# Patient Record
Sex: Male | Born: 1980 | Race: Black or African American | Hispanic: No | Marital: Single | Smoking: Former smoker
Health system: Southern US, Community
[De-identification: ages and names within clinical notes are randomized; demographics above are authoritative.]

## PROBLEM LIST (undated history)

## (undated) DIAGNOSIS — E0500 Thyrotoxicosis with diffuse goiter without thyrotoxic crisis or storm: Secondary | ICD-10-CM

## (undated) DIAGNOSIS — F1490 Cocaine use, unspecified, uncomplicated: Secondary | ICD-10-CM

## (undated) DIAGNOSIS — Z7289 Other problems related to lifestyle: Secondary | ICD-10-CM

## (undated) DIAGNOSIS — E0590 Thyrotoxicosis, unspecified without thyrotoxic crisis or storm: Secondary | ICD-10-CM

## (undated) DIAGNOSIS — R7989 Other specified abnormal findings of blood chemistry: Secondary | ICD-10-CM

## (undated) DIAGNOSIS — F1290 Cannabis use, unspecified, uncomplicated: Secondary | ICD-10-CM

## (undated) DIAGNOSIS — F1090 Alcohol use, unspecified, uncomplicated: Secondary | ICD-10-CM

## (undated) DIAGNOSIS — I4892 Unspecified atrial flutter: Secondary | ICD-10-CM

## (undated) DIAGNOSIS — Z789 Other specified health status: Secondary | ICD-10-CM

## (undated) DIAGNOSIS — J302 Other seasonal allergic rhinitis: Secondary | ICD-10-CM

## (undated) DIAGNOSIS — I429 Cardiomyopathy, unspecified: Secondary | ICD-10-CM

## (undated) DIAGNOSIS — E079 Disorder of thyroid, unspecified: Secondary | ICD-10-CM

## (undated) HISTORY — DX: Thyrotoxicosis, unspecified without thyrotoxic crisis or storm: E05.90

## (undated) HISTORY — DX: Cardiomyopathy, unspecified: I42.9

## (undated) HISTORY — DX: Cannabis use, unspecified, uncomplicated: F12.90

## (undated) HISTORY — DX: Cocaine use, unspecified, uncomplicated: F14.90

## (undated) HISTORY — DX: Other specified health status: Z78.9

## (undated) HISTORY — DX: Alcohol use, unspecified, uncomplicated: F10.90

## (undated) HISTORY — DX: Other problems related to lifestyle: Z72.89

## (undated) HISTORY — PX: OTHER SURGICAL HISTORY: SHX169

## (undated) HISTORY — DX: Disorder of thyroid, unspecified: E07.9

## (undated) HISTORY — DX: Unspecified atrial flutter: I48.92

## (undated) HISTORY — DX: Thyrotoxicosis with diffuse goiter without thyrotoxic crisis or storm: E05.00

---

## 2001-09-01 ENCOUNTER — Emergency Department (HOSPITAL_COMMUNITY): Admission: EM | Admit: 2001-09-01 | Discharge: 2001-09-01 | Payer: Self-pay | Admitting: Emergency Medicine

## 2018-01-10 ENCOUNTER — Inpatient Hospital Stay (HOSPITAL_COMMUNITY)
Admission: EM | Admit: 2018-01-10 | Discharge: 2018-01-19 | DRG: 308 | Disposition: A | Discharge status: Home / Self Care | Payer: BLUE CROSS/BLUE SHIELD | Attending: Internal Medicine | Admitting: Internal Medicine

## 2018-01-10 ENCOUNTER — Encounter (HOSPITAL_COMMUNITY): Payer: Self-pay | Admitting: *Deleted

## 2018-01-10 ENCOUNTER — Other Ambulatory Visit: Payer: Self-pay

## 2018-01-10 ENCOUNTER — Emergency Department (HOSPITAL_COMMUNITY): Payer: BLUE CROSS/BLUE SHIELD

## 2018-01-10 DIAGNOSIS — Z7151 Drug abuse counseling and surveillance of drug abuser: Secondary | ICD-10-CM

## 2018-01-10 DIAGNOSIS — I4891 Unspecified atrial fibrillation: Secondary | ICD-10-CM | POA: Diagnosis present

## 2018-01-10 DIAGNOSIS — R0602 Shortness of breath: Secondary | ICD-10-CM

## 2018-01-10 DIAGNOSIS — I313 Pericardial effusion (noninflammatory): Secondary | ICD-10-CM | POA: Diagnosis present

## 2018-01-10 DIAGNOSIS — I4892 Unspecified atrial flutter: Secondary | ICD-10-CM

## 2018-01-10 DIAGNOSIS — F1410 Cocaine abuse, uncomplicated: Secondary | ICD-10-CM | POA: Diagnosis present

## 2018-01-10 DIAGNOSIS — E663 Overweight: Secondary | ICD-10-CM | POA: Diagnosis present

## 2018-01-10 DIAGNOSIS — R7989 Other specified abnormal findings of blood chemistry: Secondary | ICD-10-CM | POA: Diagnosis not present

## 2018-01-10 DIAGNOSIS — I484 Atypical atrial flutter: Secondary | ICD-10-CM | POA: Diagnosis not present

## 2018-01-10 DIAGNOSIS — I5021 Acute systolic (congestive) heart failure: Secondary | ICD-10-CM | POA: Diagnosis present

## 2018-01-10 DIAGNOSIS — Z79899 Other long term (current) drug therapy: Secondary | ICD-10-CM

## 2018-01-10 DIAGNOSIS — J302 Other seasonal allergic rhinitis: Secondary | ICD-10-CM | POA: Diagnosis present

## 2018-01-10 DIAGNOSIS — Z6829 Body mass index (BMI) 29.0-29.9, adult: Secondary | ICD-10-CM

## 2018-01-10 DIAGNOSIS — Z833 Family history of diabetes mellitus: Secondary | ICD-10-CM

## 2018-01-10 DIAGNOSIS — I429 Cardiomyopathy, unspecified: Secondary | ICD-10-CM

## 2018-01-10 DIAGNOSIS — F17210 Nicotine dependence, cigarettes, uncomplicated: Secondary | ICD-10-CM | POA: Diagnosis present

## 2018-01-10 DIAGNOSIS — F1210 Cannabis abuse, uncomplicated: Secondary | ICD-10-CM | POA: Diagnosis present

## 2018-01-10 DIAGNOSIS — Z87898 Personal history of other specified conditions: Secondary | ICD-10-CM | POA: Diagnosis not present

## 2018-01-10 DIAGNOSIS — I959 Hypotension, unspecified: Secondary | ICD-10-CM | POA: Diagnosis not present

## 2018-01-10 DIAGNOSIS — F1411 Cocaine abuse, in remission: Secondary | ICD-10-CM

## 2018-01-10 DIAGNOSIS — E0500 Thyrotoxicosis with diffuse goiter without thyrotoxic crisis or storm: Secondary | ICD-10-CM | POA: Diagnosis present

## 2018-01-10 DIAGNOSIS — E0590 Thyrotoxicosis, unspecified without thyrotoxic crisis or storm: Secondary | ICD-10-CM | POA: Diagnosis present

## 2018-01-10 DIAGNOSIS — R002 Palpitations: Secondary | ICD-10-CM | POA: Diagnosis not present

## 2018-01-10 DIAGNOSIS — Z8371 Family history of colonic polyps: Secondary | ICD-10-CM

## 2018-01-10 DIAGNOSIS — I428 Other cardiomyopathies: Secondary | ICD-10-CM | POA: Diagnosis present

## 2018-01-10 HISTORY — DX: Other seasonal allergic rhinitis: J30.2

## 2018-01-10 HISTORY — DX: Unspecified atrial flutter: I48.92

## 2018-01-10 HISTORY — DX: Other specified abnormal findings of blood chemistry: R79.89

## 2018-01-10 LAB — CBC WITH DIFFERENTIAL/PLATELET
Basophils Absolute: 0 10*3/uL (ref 0.0–0.1)
Basophils Relative: 0 %
Eosinophils Absolute: 0.1 10*3/uL (ref 0.0–0.7)
Eosinophils Relative: 1 %
HEMATOCRIT: 43.6 % (ref 39.0–52.0)
HEMOGLOBIN: 14.2 g/dL (ref 13.0–17.0)
LYMPHS ABS: 2.7 10*3/uL (ref 0.7–4.0)
Lymphocytes Relative: 35 %
MCH: 27.5 pg (ref 26.0–34.0)
MCHC: 32.6 g/dL (ref 30.0–36.0)
MCV: 84.3 fL (ref 78.0–100.0)
MONOS PCT: 6 %
Monocytes Absolute: 0.5 10*3/uL (ref 0.1–1.0)
NEUTROS ABS: 4.5 10*3/uL (ref 1.7–7.7)
NEUTROS PCT: 58 %
Platelets: 228 10*3/uL (ref 150–400)
RBC: 5.17 MIL/uL (ref 4.22–5.81)
RDW: 14.5 % (ref 11.5–15.5)
WBC: 7.8 10*3/uL (ref 4.0–10.5)

## 2018-01-10 LAB — COMPREHENSIVE METABOLIC PANEL
ALBUMIN: 3.6 g/dL (ref 3.5–5.0)
ALK PHOS: 93 U/L (ref 38–126)
ALT: 28 U/L (ref 17–63)
AST: 24 U/L (ref 15–41)
Anion gap: 11 (ref 5–15)
BUN: 16 mg/dL (ref 6–20)
CO2: 19 mmol/L — AB (ref 22–32)
Calcium: 9 mg/dL (ref 8.9–10.3)
Chloride: 113 mmol/L — ABNORMAL HIGH (ref 101–111)
Creatinine, Ser: 0.89 mg/dL (ref 0.61–1.24)
GFR calc Af Amer: >60 mL/min (ref >60)
GFR calc non Af Amer: >60 mL/min (ref >60)
GLUCOSE: 92 mg/dL (ref 65–99)
POTASSIUM: 3.8 mmol/L (ref 3.5–5.1)
SODIUM: 143 mmol/L (ref 135–145)
Total Bilirubin: 1.8 mg/dL — ABNORMAL HIGH (ref 0.3–1.2)
Total Protein: 6.5 g/dL (ref 6.5–8.1)

## 2018-01-10 LAB — I-STAT TROPONIN, ED: Troponin i, poc: 0.02 ng/mL (ref 0.00–0.08)

## 2018-01-10 LAB — ETHANOL: Alcohol, Ethyl (B): <10 mg/dL (ref <10)

## 2018-01-10 LAB — URINALYSIS, ROUTINE W REFLEX MICROSCOPIC
Bilirubin Urine: NEGATIVE
GLUCOSE, UA: NEGATIVE mg/dL
Hgb urine dipstick: NEGATIVE
Ketones, ur: 5 mg/dL — AB
LEUKOCYTES UA: NEGATIVE
Nitrite: NEGATIVE
PROTEIN: NEGATIVE mg/dL
Specific Gravity, Urine: 1.025 (ref 1.005–1.030)
pH: 6 (ref 5.0–8.0)

## 2018-01-10 LAB — RAPID URINE DRUG SCREEN, HOSP PERFORMED
AMPHETAMINES: NOT DETECTED
BENZODIAZEPINES: NOT DETECTED
Barbiturates: NOT DETECTED
Cocaine: NOT DETECTED
OPIATES: NOT DETECTED
TETRAHYDROCANNABINOL: POSITIVE — AB

## 2018-01-10 LAB — MRSA PCR SCREENING: MRSA BY PCR: NEGATIVE

## 2018-01-10 LAB — PROTIME-INR
INR: 1.29
Prothrombin Time: 16 seconds — ABNORMAL HIGH (ref 11.4–15.2)

## 2018-01-10 LAB — LACTIC ACID, PLASMA: Lactic Acid, Venous: 1.2 mmol/L (ref 0.5–1.9)

## 2018-01-10 LAB — TROPONIN I: TROPONIN I: 0.04 ng/mL — AB (ref <0.03)

## 2018-01-10 LAB — TSH: TSH: <0.01 u[IU]/mL — ABNORMAL LOW (ref 0.350–4.500)

## 2018-01-10 LAB — T4, FREE: FREE T4: 4.13 ng/dL — AB (ref 0.82–1.77)

## 2018-01-10 LAB — HEPARIN LEVEL (UNFRACTIONATED): Heparin Unfractionated: 0.39 IU/mL (ref 0.30–0.70)

## 2018-01-10 LAB — PHOSPHORUS: Phosphorus: 3.5 mg/dL (ref 2.5–4.6)

## 2018-01-10 LAB — MAGNESIUM: Magnesium: 1.8 mg/dL (ref 1.7–2.4)

## 2018-01-10 MED ORDER — SODIUM CHLORIDE 0.9 % IV SOLN
INTRAVENOUS | Status: AC
  Administered 2018-01-10 – 2018-01-11 (×2): via INTRAVENOUS

## 2018-01-10 MED ORDER — ALPRAZOLAM 0.25 MG PO TABS
0.2500 mg | ORAL_TABLET | Freq: Once | ORAL | Status: AC
  Administered 2018-01-11: 0.25 mg via ORAL
  Filled 2018-01-10: qty 1

## 2018-01-10 MED ORDER — ACETAMINOPHEN 650 MG RE SUPP: 650.0000 mg | Freq: Four times a day (QID) | RECTAL | Status: DC | PRN

## 2018-01-10 MED ORDER — LORAZEPAM 1 MG PO TABS: 1.0000 mg | ORAL_TABLET | Freq: Four times a day (QID) | ORAL | Status: AC | PRN

## 2018-01-10 MED ORDER — THIAMINE HCL 100 MG/ML IJ SOLN: 100.0000 mg | Freq: Every day | INTRAMUSCULAR | Status: DC

## 2018-01-10 MED ORDER — FOLIC ACID 1 MG PO TABS
1.0000 mg | ORAL_TABLET | Freq: Every day | ORAL | Status: DC
  Administered 2018-01-11 – 2018-01-19 (×9): 1 mg via ORAL
  Filled 2018-01-10 (×9): qty 1

## 2018-01-10 MED ORDER — ONDANSETRON HCL 4 MG PO TABS: 4.0000 mg | ORAL_TABLET | Freq: Four times a day (QID) | ORAL | Status: DC | PRN

## 2018-01-10 MED ORDER — VITAMIN B-1 100 MG PO TABS
100.0000 mg | ORAL_TABLET | Freq: Every day | ORAL | Status: DC
  Administered 2018-01-11 – 2018-01-19 (×9): 100 mg via ORAL
  Filled 2018-01-10 (×9): qty 1

## 2018-01-10 MED ORDER — DILTIAZEM HCL-DEXTROSE 100-5 MG/100ML-% IV SOLN (PREMIX)
5.0000 mg/h | INTRAVENOUS | Status: DC
  Administered 2018-01-10 (×3): 15 mg/h via INTRAVENOUS
  Administered 2018-01-10: 5 mg/h via INTRAVENOUS
  Administered 2018-01-11 (×3): 15 mg/h via INTRAVENOUS
  Administered 2018-01-12: 5 mg/h via INTRAVENOUS
  Administered 2018-01-12: 15 mg/h via INTRAVENOUS
  Administered 2018-01-12: 10 mg/h via INTRAVENOUS
  Filled 2018-01-10 (×11): qty 100

## 2018-01-10 MED ORDER — HEPARIN BOLUS VIA INFUSION
4000.0000 [IU] | Freq: Once | INTRAVENOUS | Status: AC
  Administered 2018-01-10: 4000 [IU] via INTRAVENOUS
  Filled 2018-01-10: qty 4000

## 2018-01-10 MED ORDER — DILTIAZEM LOAD VIA INFUSION
20.0000 mg | Freq: Once | INTRAVENOUS | Status: AC
  Administered 2018-01-10: 20 mg via INTRAVENOUS
  Filled 2018-01-10: qty 20

## 2018-01-10 MED ORDER — ONDANSETRON HCL 4 MG/2ML IJ SOLN: 4.0000 mg | Freq: Four times a day (QID) | INTRAMUSCULAR | Status: DC | PRN

## 2018-01-10 MED ORDER — DILTIAZEM HCL 25 MG/5ML IV SOLN: 15.0000 mg | Freq: Once | INTRAVENOUS | Status: DC

## 2018-01-10 MED ORDER — ADULT MULTIVITAMIN W/MINERALS CH
1.0000 | ORAL_TABLET | Freq: Every day | ORAL | Status: DC
Start: 2018-01-11 — End: 2018-01-19
  Administered 2018-01-11 – 2018-01-19 (×9): 1 via ORAL
  Filled 2018-01-10 (×9): qty 1

## 2018-01-10 MED ORDER — DILTIAZEM LOAD VIA INFUSION
15.0000 mg | Freq: Once | INTRAVENOUS | Status: AC
  Administered 2018-01-10: 15 mg via INTRAVENOUS
  Filled 2018-01-10: qty 15

## 2018-01-10 MED ORDER — HEPARIN (PORCINE) IN NACL 100-0.45 UNIT/ML-% IJ SOLN
1500.0000 [IU]/h | INTRAMUSCULAR | Status: DC
  Administered 2018-01-10 – 2018-01-11 (×2): 1500 [IU]/h via INTRAVENOUS
  Filled 2018-01-10 (×2): qty 250

## 2018-01-10 MED ORDER — ACETAMINOPHEN 325 MG PO TABS
650.0000 mg | ORAL_TABLET | Freq: Four times a day (QID) | ORAL | Status: DC | PRN
  Administered 2018-01-17 – 2018-01-18 (×3): 650 mg via ORAL
  Filled 2018-01-10 (×3): qty 2

## 2018-01-10 MED ORDER — LORAZEPAM 2 MG/ML IJ SOLN: 1.0000 mg | Freq: Four times a day (QID) | INTRAMUSCULAR | Status: AC | PRN

## 2018-01-10 NOTE — H&P (Signed)
Logan Meadows:[id number] DOB: 1981-07-28 DOA: 01/10/2018     PCP: Patient, No Pcp Per   Outpatient Specialists:  CARDS:  Dr. Eden Emms  Patient arrived to ER on 01/10/18 at 1236  Patient coming from:   home Lives  With family    Chief Complaint:  Chief Complaint  Patient presents with  . Palpitations    HPI: Logan Meadows is a 37 y.o. male with medical history significant of allergies hx of Cocaine and Marijuana    Presented with 1 week ago he developed  Palpitations while mowing his lawn associated with Shortness of breath and fatigue.   Patient attempted to rest and drink some water palpitation symptom improved.  The palpitations have resumed every time he exerts himself he noticed also worsening shortness of breath with exertion has been lightheaded when he tries to stand up but did not pass out no leg swelling but has been short of breath at night because of his difficulties he presented to urgent care today at first he was treated with albuterol for shortness of breath but then noted to be extremely tachycardic he was found to be in atrial fibrillation sent to emergency department on arrival heart rate greater than 170 Reports cocaine was 1 weeks ago no prior history of diabetes hypertension coronary artery disease but does not follow his primary care provider.  Of note reportedly had an episode of syncope 1 month ago after standing up. He reports increased sweating feeling hot, diarrhea, losing weight for  3 weeks. He does not have regular doctor, no hx of thyroid problems in the past.    He reports drinking plenty of water but he has been  Sweating mpre than usual.   Reports drinks 12 pack only on weekends have not had any alcohol for >6 days.    While in ER: Cardiology consulted patient started on Cardizem drip for rate come down to 130s sttarted on heparin He was noted to have TSH less than 0.01  Following Medications were ordered in ER: Medications    diltiazem (CARDIZEM) 100 mg in dextrose 5% (1 mg/mL) infusion (15 mg/hr Intravenous New Bag/Given 01/10/18 1740)  heparin ADULT infusion 100 units/mL (25000 units/248mL sodium chloride 0.45%) (1,500 Units/hr Intravenous New Bag/Given 01/10/18 1343)  diltiazem (CARDIZEM) 1 mg/mL load via infusion 20 mg (20 mg Intravenous Bolus from Bag 01/10/18 1318)  heparin bolus via infusion 4,000 Units (4,000 Units Intravenous Bolus from Bag 01/10/18 1346)  diltiazem (CARDIZEM) 1 mg/mL load via infusion 15 mg (15 mg Intravenous Bolus from Bag 01/10/18 1440)    Significant initial  Findings: Abnormal Labs Reviewed  COMPREHENSIVE METABOLIC PANEL - Abnormal; Notable for the following components:      Result Value   Chloride 113 (*)    CO2 19 (*)    Total Bilirubin 1.8 (*)    All other components within normal limits  PROTIME-INR - Abnormal; Notable for the following components:   Prothrombin Time 16.0 (*)    All other components within normal limits  URINALYSIS, ROUTINE W REFLEX MICROSCOPIC - Abnormal; Notable for the following components:   Ketones, ur 5 (*)    All other components within normal limits  RAPID URINE DRUG SCREEN, HOSP PERFORMED - Abnormal; Notable for the following components:   Tetrahydrocannabinol POSITIVE (*)    All other components within normal limits  TSH - Abnormal; Notable for the following components:   TSH <0.010 (*)    All other components within normal limits  Na 143 K 3.8  Cr    stable,   Lab Results  Component Value Date   CREATININE 0.89 01/10/2018      WBC  7.8  HG/HCT     Component Value Date/Time   HGB 14.2 01/10/2018 1308   HCT 43.6 01/10/2018 1308    Troponin (Point of Care Test) Recent Labs    01/10/18 1312  TROPIPOC 0.02   INR 1.29 Mg 1.8 phosph 3.5    UA   no evidence of UTI       CXR -  NON acute   ECG:  Personally reviewed by me showing: HR : 185 Rhythm:  A.fib.RVR Ischemic changes nonspecific changes due to LVH QTC  611     ED Triage Vitals  Enc Vitals Group     BP 01/10/18 1249 (!) 124/94     Pulse Rate 01/10/18 1249 (!) 183     Resp 01/10/18 1249 (!) 24     Temp 01/10/18 1322 98.9 F (37.2 C)     Temp Source 01/10/18 1322 Oral     SpO2 01/10/18 1249 99 %     Weight 01/10/18 1307 250 lb (113.4 kg)     Height 01/10/18 1307 6\' 2"  (1.88 m)     Head Circumference --      Peak Flow --      Pain Score 01/10/18 1323 0     Pain Loc --      Pain Edu? --      Excl. in GC? --   TMAX(24)@       Latest  Blood pressure 117/75, pulse (!) 137, temperature 98.9 F (37.2 C), temperature source Oral, resp. rate (!) 23, height 6\' 2"  (1.88 m), weight 113.4 kg (250 lb), SpO2 96 %.   ER Provider Called: Cardiology    Dr. Eden Emms They Recommend admit to medicine Will see in  in ER  Hospitalist was called for admission for A. fib with RVR in the setting of hyper thyroidism   Review of Systems:    Pertinent positives include:  fatigue, syncope, Palpitations,  shortness of breath at rest, dyspnea on exertion, Constitutional:  No weight loss, night sweats, Fevers, chills, weight loss  HEENT:  No headaches, Difficulty swallowing,Tooth/dental problems,Sore throat,  No sneezing, itching, ear ache, nasal congestion, post nasal drip,  Cardio-vascular:  No chest pain, Orthopnea, PND, anasarca, dizziness, .no Bilateral lower extremity swelling  GI:  No heartburn, indigestion, abdominal pain, nausea, vomiting, diarrhea, change in bowel habits, loss of appetite, melena, blood in stool, hematemesis Resp:   No excess mucus, no productive cough, No non-productive cough, No coughing up of blood.No change in color of mucus.No wheezing. Skin:  no rash or lesions. No jaundice GU:  no dysuria, change in color of urine, no urgency or frequency. No straining to urinate.  No flank pain.  Musculoskeletal:  No joint pain or no joint swelling. No decreased range of motion. No back pain.  Psych:  No change in mood or  affect. No depression or anxiety. No memory loss.  Neuro: no localizing neurological complaints, no tingling, no weakness, no double vision, no gait abnormality, no slurred speech, no confusion  As per HPI otherwise 10 point review of systems negative.   Past Medical History:   Past Medical History:  Diagnosis Date  . Abnormal TSH 01/10/2018  . Atrial fibrillation with rapid ventricular response (HCC) 01/10/2018  . Seasonal allergies       Past Surgical History:  Procedure Laterality  Date  . None      Social History:  Ambulatory  independently      reports that he has been smoking.  He has been smoking about 0.25 packs per day. He has never used smokeless tobacco. He reports that he drinks about 7.2 oz of alcohol per week. He reports that he has current or past drug history. Drugs: Marijuana and Cocaine. Frequency: 7.00 times per week.     Family History:   Family History  Problem Relation Age of Onset  . Diabetes Mother   . Diabetes Father   . Colon polyps Father     Allergies: No Known Allergies   Prior to Admission medications   Medication Sig Start Date End Date Taking? Authorizing Provider  acetaminophen (TYLENOL) 500 MG tablet Take 500 mg by mouth every 6 (six) hours as needed.   Yes [provider]  cetirizine (ZYRTEC) 10 MG tablet Take 10 mg by mouth daily.   Yes [provider]  fluticasone (FLONASE) 50 MCG/ACT nasal spray Place 2 sprays into both nostrils daily as needed for allergies or rhinitis.   Yes [provider]   Physical Exam: Blood pressure 117/75, pulse (!) 137, temperature 98.9 F (37.2 C), temperature source Oral, resp. rate (!) 23, height 6\' 2"  (1.88 m), weight 113.4 kg (250 lb), SpO2 96 %. 1. General:  in No Acute distress  well   -appearing 2. Psychological: Alert and  Oriented 3. Head/ENT:    Dry Mucous Membranes                          Head Non traumatic, neck supple                          Normal   Dentition Thyroid no evidence of goiter noted appears slightly rubbery more on the right no palpable nodules nontender 4. SKIN: decreased Skin turgor,  Skin clean Dry and intact no rash 5. Heart: rapid, irRegular rate and rhythm no  Murmur, no Rub or gallop 6. Lungs:  Clear to auscultation bilaterally, no wheezes or crackles   7. Abdomen: Soft,  non-tender, Non distended    bowel sounds present 8. Lower extremities: no clubbing, cyanosis, or edema 9. Neurologically Grossly intact, moving all 4 extremities equally  10. MSK: Normal range of motion   LABS:     Recent Labs  Lab 01/10/18 1308  WBC 7.8  NEUTROABS 4.5  HGB 14.2  HCT 43.6  MCV 84.3  PLT 228   Basic Metabolic Panel: Recent Labs  Lab 01/10/18 1308  NA 143  K 3.8  CL 113*  CO2 19*  GLUCOSE 92  BUN 16  CREATININE 0.89  CALCIUM 9.0  MG 1.8  PHOS 3.5      Recent Labs  Lab 01/10/18 1308  AST 24  ALT 28  ALKPHOS 93  BILITOT 1.8*  PROT 6.5  ALBUMIN 3.6   No results for input(s): LIPASE, AMYLASE in the last 168 hours. No results for input(s): AMMONIA in the last 168 hours.    HbA1C: No results for input(s): HGBA1C in the last 72 hours. CBG: No results for input(s): GLUCAP in the last 168 hours.    Urine analysis:    Component Value Date/Time   COLORURINE YELLOW 01/10/2018 1623   APPEARANCEUR CLEAR 01/10/2018 1623   LABSPEC 1.025 01/10/2018 1623   PHURINE 6.0 01/10/2018 1623   GLUCOSEU NEGATIVE 01/10/2018 1623  HGBUR NEGATIVE 01/10/2018 1623   BILIRUBINUR NEGATIVE 01/10/2018 1623   KETONESUR 5 (A) 01/10/2018 1623   PROTEINUR NEGATIVE 01/10/2018 1623   NITRITE NEGATIVE 01/10/2018 1623   LEUKOCYTESUR NEGATIVE 01/10/2018 1623      Cultures: No results found for: SDES, SPECREQUEST, CULT, REPTSTATUS   Radiological Exams on Admission: Dg Chest Port 1 View  Result Date: 01/10/2018 CLINICAL DATA:  Fluttering sensation in the chest for the past week. EXAM: PORTABLE CHEST 1 VIEW COMPARISON:   None. FINDINGS: Mild basilar atelectasis is seen. Heart size is upper normal. No pneumothorax or pleural effusion. No acute bony abnormality. IMPRESSION: No acute disease. Electronically Signed   By: Drusilla Kanner M.D.   On: 01/10/2018 14:24    Chart has been reviewed    Assessment/Plan  37 y.o. male with medical history significant of allergies hx of Cocaine and [place] Admitted for onset of A. fib with RVR in the setting of hyperthyroidism  Present on Admission: . Atrial fibrillation with rapid ventricular response (HCC) -in the setting of possible thyroidism history of cocaine abuse. Appreciate cardiology consult will transition to DOCA when stable, continue with Cardizem for now given history of cocaine order echogram.  . Abnormal TSH -low TSH, suspect hyperthyroidism  check T4 T3 Check Thyrotropin receptor Ab On Physical exam no evidence of tenderness but noted slight asymetry discernible nodules palpated  Discussed with radiology will order thyroid reuptake scan If evidence of Graves' disease would need to have discussion regarding long-term treatment methimazole versus radioiodine ablation  hx of cocaine abuse -cardiology felt not a candidate for beta-blocker but patient states he is agreeable to abstain from cocaine last cocaine was 1 week ago.  Defer to cardiology to see if it was safe to start if patient is reliable.  social work consult  Other plan as per orders.  DVT prophylaxis: Heparin  Code Status:  FULL CODE as per patient   I had personally discussed CODE STATUS with patient   Family Communication:   Family not  at  Bedside    Disposition Plan:     To home once workup is complete and patient is stable                      Social Work   consulted                          Consults called: Cardiology aware  Admission status:   obs   Level of care        SDU        Kaedynce Tapp 01/10/2018, 7:57 PM    [facility]  Pager [phone]    after 2 AM please page floor coverage PA If 7AM-7PM, please contact the day team taking care of the patient  [email or web address]  Password [email or web address]

## 2018-01-10 NOTE — ED Notes (Signed)
ED TO INPATIENT HANDOFF REPORT  Name/Age/Gender Logan Meadows 37 y.o. male  Code Status   Home/SNF/Other Home  Chief Complaint heart flutter  Level of Care/Admitting Diagnosis ED Disposition    ED Disposition Condition [facility] Area: [place] [100102]  Level of Care: Stepdown [14]  Admit to SDU based on following criteria: Hemodynamic compromise or significant risk of instability:  Patient requiring short term acute titration and management of vasoactive drips, and invasive monitoring (i.e., CVP and Arterial line).  Diagnosis: Atrial fibrillation with RVR [facility]) [[id number]]  Admitting Physician: Toy Baker [[id number]]  Attending Physician: Toy Baker [[id number]]  PT Class (Do Not Modify): Observation [104]  PT Acc Code (Do Not Modify): Observation [10022]       Medical History Past Medical History:  Diagnosis Date  . Abnormal TSH 01/10/2018  . Atrial fibrillation with rapid ventricular response ([place]) 01/10/2018  . Seasonal allergies     Allergies No Known Allergies  IV Location/Drains/Wounds Patient Lines/Drains/Airways Status   Active Line/Drains/Airways    Name:   Placement date:   Placement time:   Site:   Days:   Peripheral IV 01/10/18 Right Hand   01/10/18    1326    Hand   less than 1   Peripheral IV 01/10/18 Left Antecubital   01/10/18    1326    Antecubital   less than 1          Labs/Imaging Results for orders placed or performed during the hospital encounter of 01/10/18 (from the past 48 hour(s))  Comprehensive metabolic panel     Status: Abnormal   Collection Time: 01/10/18  1:08 PM  Result Value Ref Range   Sodium 143 135 - 145 mmol/L   Potassium 3.8 3.5 - 5.1 mmol/L   Chloride 113 (H) 101 - 111 mmol/L   CO2 19 (L) 22 - 32 mmol/L   Glucose, Bld 92 65 - 99 mg/dL   BUN 16 6 - 20 mg/dL   Creatinine, Ser 0.89 0.61 - 1.24 mg/dL   Calcium 9.0 8.9 - 10.3 mg/dL   Total Protein 6.5 6.5 - 8.1 g/dL   Albumin 3.6 3.5 - 5.0 g/dL   AST 24 15 - 41 U/L   ALT 28 17 - 63 U/L   Alkaline Phosphatase 93 38 - 126 U/L   Total Bilirubin 1.8 (H) 0.3 - 1.2 mg/dL   GFR calc non Af Amer >60 >60 mL/min   GFR calc Af Amer >60 >60 mL/min    Comment: (NOTE) The eGFR has been calculated using the CKD EPI equation. This calculation has not been validated in all clinical situations. eGFR's persistently <60 mL/min signify possible Chronic Kidney Disease.    Anion gap 11 5 - 15    Comment: Performed at [facility], [place] [address] [place], [place] [address]  Ethanol     Status: None   Collection Time: 01/10/18  1:08 PM  Result Value Ref Range   Alcohol, Ethyl (B) <10 <10 mg/dL    Comment: (NOTE) Lowest detectable limit for serum alcohol is 10 mg/dL. For medical purposes only. Performed at [facility], [place] [address], [place],  [address]   CBC with Differential     Status: None   Collection Time: 01/10/18  1:08 PM  Result Value Ref Range   WBC 7.8 4.0 - 10.5 K/uL   RBC 5.17 4.22 - 5.81 MIL/uL   Hemoglobin 14.2 13.0 - 17.0 g/dL  HCT 43.6 39.0 - 52.0 %   MCV 84.3 78.0 - 100.0 fL   MCH 27.5 26.0 - 34.0 pg   MCHC 32.6 30.0 - 36.0 g/dL   RDW 14.5 11.5 - 15.5 %   Platelets 228 150 - 400 K/uL   Neutrophils Relative % 58 %   Neutro Abs 4.5 1.7 - 7.7 K/uL   Lymphocytes Relative 35 %   Lymphs Abs 2.7 0.7 - 4.0 K/uL   Monocytes Relative 6 %   Monocytes Absolute 0.5 0.1 - 1.0 K/uL   Eosinophils Relative 1 %   Eosinophils Absolute 0.1 0.0 - 0.7 K/uL   Basophils Relative 0 %   Basophils Absolute 0.0 0.0 - 0.1 K/uL    Comment: Performed at [facility] [place] [address], [place], [place] [address]  Protime-INR     Status: Abnormal   Collection Time: 01/10/18  1:08 PM  Result Value Ref Range   Prothrombin Time 16.0 (H) 11.4 - 15.2 seconds   INR 1.29     Comment: Performed at [facility], [place] [address]  [place], [place] [address]  TSH     Status: Abnormal   Collection Time: 01/10/18  1:08 PM  Result Value Ref Range   TSH <0.010 (L) 0.350 - 4.500 uIU/mL    Comment: Performed at [facility], [place] [address] [place], [place] [address]  Magnesium     Status: None   Collection Time: 01/10/18  1:08 PM  Result Value Ref Range   Magnesium 1.8 1.7 - 2.4 mg/dL    Comment: Performed at [facility], [place] [address], [place], [place] [address]  Phosphorus     Status: None   Collection Time: 01/10/18  1:08 PM  Result Value Ref Range   Phosphorus 3.5 2.5 - 4.6 mg/dL    Comment: Performed at [facility], [place] [address] [place], [place] [address]  I-stat troponin, ED     Status: None   Collection Time: 01/10/18  1:12 PM  Result Value Ref Range   Troponin i, poc 0.02 0.00 - 0.08 ng/mL   Comment 3            Comment: Due to the release kinetics of cTnI, a negative result within the first hours of the onset of symptoms does not rule out myocardial infarction with certainty. If myocardial infarction is still suspected, repeat the test at appropriate intervals.   Urinalysis, Routine w reflex microscopic     Status: Abnormal   Collection Time: 01/10/18  4:23 PM  Result Value Ref Range   Color, Urine YELLOW YELLOW   APPearance CLEAR CLEAR   Specific Gravity, Urine 1.025 1.005 - 1.030   pH 6.0 5.0 - 8.0   Glucose, UA NEGATIVE NEGATIVE mg/dL   Hgb urine dipstick NEGATIVE NEGATIVE   Bilirubin Urine NEGATIVE NEGATIVE   Ketones, ur 5 (A) NEGATIVE mg/dL   Protein, ur NEGATIVE NEGATIVE mg/dL   Nitrite NEGATIVE NEGATIVE   Leukocytes, UA NEGATIVE NEGATIVE    Comment: Performed at [facility] [address], [place], [place] [address]  Urine rapid drug screen (hosp performed)     Status: Abnormal   Collection Time: 01/10/18  4:23 PM  Result Value Ref Range   Opiates NONE DETECTED NONE DETECTED   Cocaine NONE DETECTED NONE  DETECTED   Benzodiazepines NONE DETECTED NONE DETECTED   Amphetamines NONE DETECTED NONE DETECTED   Tetrahydrocannabinol POSITIVE (A) NONE DETECTED  Barbiturates NONE DETECTED NONE DETECTED    Comment: (NOTE) DRUG SCREEN FOR MEDICAL PURPOSES ONLY.  IF CONFIRMATION IS NEEDED FOR ANY PURPOSE, NOTIFY LAB WITHIN 5 DAYS. LOWEST DETECTABLE LIMITS FOR URINE DRUG SCREEN Drug Class                     Cutoff (ng/mL) Amphetamine and metabolites    1000 Barbiturate and metabolites    200 Benzodiazepine                 875 Tricyclics and metabolites     300 Opiates and metabolites        300 Cocaine and metabolites        300 THC                            50 Performed at [facility], [place] [address] [place], [place] [address]    Dg Chest Port 1 View  Result Date: 01/10/2018 CLINICAL DATA:  Fluttering sensation in the chest for the past week. EXAM: PORTABLE CHEST 1 VIEW COMPARISON:  None. FINDINGS: Mild basilar atelectasis is seen. Heart size is upper normal. No pneumothorax or pleural effusion. No acute bony abnormality. IMPRESSION: No acute disease. Electronically Signed   By: Inge Rise M.D.   On: 01/10/2018 14:24    Pending Labs Unresulted Labs (From admission, onward)   Start     Ordered   01/11/18 0500  CBC  Daily,   R     01/10/18 1314   01/10/18 2000  Heparin level (unfractionated)  Once-Timed,   R     01/10/18 1314   01/10/18 1620  T3, free  Once,   R     01/10/18 1619   01/10/18 1620  T4, free  Once,   R     01/10/18 1619   01/10/18 1620  Thyroid peroxidase antibody  Once,   R     01/10/18 1619   Signed and Held  HIV antibody (Routine Testing)  Tomorrow morning,   R     Signed and Held   Signed and Held  Magnesium  Tomorrow morning,   R    Comments:  Call MD if <1.5    Signed and Held   Signed and Held  Phosphorus  Tomorrow morning,   R     Signed and Held   Signed and Held  TSH  Once,   R    Comments:  Cancel if already done within 1 month  and notify MD    Signed and Held   Signed and Held  Comprehensive metabolic panel  Once,   R    Comments:  Cal MD for K<3.5 or >5.0    Signed and Held   Visual merchandiser and Held  CBC  Once,   R    Comments:  Call for hg <8.0    Signed and Held   Signed and Held  Troponin I  Now then every 6 hours,   R     Signed and Held   Signed and Held  Hemoglobin A1c  Tomorrow morning,   R    Comments:  Cancel if has been done within past month and notify MD    Signed and Held      Vitals/Pain Today's Vitals   01/10/18 1751 01/10/18 1800 01/10/18 1900 01/10/18 1931  BP: 116/76 117/75 110/66 112/80  Pulse: (!) 137 (!) 137 (!) 137 (!) 136  Resp: (!) 22 (!) 23 16 (!) 27  Temp:      TempSrc:      SpO2: 97% 96% 99% 97%  Weight:      Height:      PainSc:        Isolation Precautions No active isolations  Medications Medications  diltiazem (CARDIZEM) 100 mg in dextrose 5% 172m (1 mg/mL) infusion (15 mg/hr Intravenous New Bag/Given 01/10/18 1740)  heparin ADULT infusion 100 units/mL (25000 units/2527msodium chloride 0.45%) (1,500 Units/hr Intravenous New Bag/Given 01/10/18 1343)  diltiazem (CARDIZEM) 1 mg/mL load via infusion 20 mg (20 mg Intravenous Bolus from Bag 01/10/18 1318)  heparin bolus via infusion 4,000 Units (4,000 Units Intravenous Bolus from Bag 01/10/18 1346)  diltiazem (CARDIZEM) 1 mg/mL load via infusion 15 mg (15 mg Intravenous Bolus from Bag 01/10/18 1440)    Mobility walks with person assist

## 2018-01-10 NOTE — Consult Note (Addendum)
Cardiology Consultation:   Patient ID: COLESYN SHANGRAW; [id number]; July 27, 1981   Admit date: 01/10/2018 Date of Consult: 01/10/2018  Primary Care Provider: Patient, No Pcp Per Primary Cardiologist: New Primary Electrophysiologist:  n/a   Patient Profile:   NOSSON COVAULT is a 37 y.o. male with no cardiac hx, no hx DM, HTN, HLD. No regular checkups. He has hx of seasonal allergies (wears a mask to mow)  treated w/ OTC rx, tob use, THC and cocaine use, who is being seen today for the evaluation of rapid atrial fib at the request of Dr Donnald Garre.  History of Present Illness:   Mr. Yarman was in his [place] last weekend. He was mowing without a mask and had onset of palpitations.  He was a little weak and short of breath with them.  He rested, drank cold water.  He was more diaphoretic than usual, but did not think much about it.  The palpitations resolved.  Since that time, he has noticed palpitations every time he walks fast, climb stairs or lift something heavy.  He has also noticed increased dyspnea on exertion.  He describes orthostatic dizziness, but has not had near syncope.  He has had orthopnea and PND this week, new for him.  He denies lower extremity edema.  He was having the increased dyspnea on exertion and just felt generally a little short of breath.  Possibly some wheezing, not much.  Finally, because of his breathing, he went to an urgent care today.  They noted that his heart rate was extremely elevated and sent him to the emergency room.  In the emergency room he was in coarse atrial fibrillation/atypical atrial flutter with a heart rate greater than 170.  At that time, he did have palpitations.  He is currently on Cardizem IV at 15 mg an hour and is not having any symptoms at all.  At no time during the past week did he check his blood pressure which he normally does not do anyway.  About a month ago, he went to the bathroom to have a bowel movement.  He stood up  and was washing his hands at the sink without symptoms,then he woke up on the floor.  He had no significant injuries, had not been incontinent, and knew immediately who he was and where he was.  Nothing like that had ever happened before.  Past Medical History:  Diagnosis Date  . Abnormal TSH 01/10/2018  . Atrial fibrillation with rapid ventricular response (HCC) 01/10/2018  . Seasonal allergies     Past Surgical History:  Procedure Laterality Date  . None       Prior to Admission medications   Medication Sig Start Date End Date Taking? Authorizing Provider  acetaminophen (TYLENOL) 500 MG tablet Take 500 mg by mouth every 6 (six) hours as needed.   Yes [provider]  cetirizine (ZYRTEC) 10 MG tablet Take 10 mg by mouth daily.   Yes [provider]  fluticasone (FLONASE) 50 MCG/ACT nasal spray Place 2 sprays into both nostrils daily as needed for allergies or rhinitis.   Yes [provider]    Inpatient Medications: Scheduled Meds:  Continuous Infusions: . diltiazem (CARDIZEM) infusion 15 mg/hr (01/10/18 1447)  . heparin 1,500 Units/hr (01/10/18 1343)   PRN Meds:   Allergies:   No Known Allergies  Social History:   Social History   Socioeconomic History  . Marital status: Single    Spouse name: Not on file  . Number of  children: Not on file  . Years of education: Not on file  . Highest education level: Not on file  Occupational History  . Occupation: Sports administrator person    Employer: [employer]  Social Needs  . Financial resource strain: Not on file  . Food insecurity:    Worry: Not on file    Inability: Not on file  . Transportation needs:    Medical: Not on file    Non-medical: Not on file  Tobacco Use  . Smoking status: Current Every Day Smoker    Packs/day: 0.25  . Smokeless tobacco: Never Used  . Tobacco comment: 1 pack on the weekends  Substance and Sexual Activity  . Alcohol use: Yes    Alcohol/week: 7.2 oz    Types: 12 Cans of beer  per week    Comment: 12 pack per weekend  . Drug use: Yes    Frequency: 7.0 times per week    Types: Marijuana, Cocaine    Comment: daily marijuana, cocaine on weekends  . Sexual activity: Not on file  Lifestyle  . Physical activity:    Days per week: Not on file    Minutes per session: Not on file  . Stress: Not on file  Relationships  . Social connections:    Talks on phone: Not on file    Gets together: Not on file    Attends religious service: Not on file    Active member of club or organization: Not on file    Attends meetings of clubs or organizations: Not on file    Relationship status: Not on file  . Intimate partner violence:    Fear of current or ex partner: Not on file    Emotionally abused: Not on file    Physically abused: Not on file    Forced sexual activity: Not on file  Other Topics Concern  . Not on file  Social History Narrative   Pt lives with his mother.     Family History:   Family History  Problem Relation Age of Onset  . Diabetes Mother   . Diabetes Father   . Colon polyps Father    Family Status:  Family Status  Relation Name Status  . Mother  Alive  . Father  Alive    ROS:  Please see the history of present illness.  All other ROS reviewed and negative.     Physical Exam/Data:   Vitals:   01/10/18 1431 01/10/18 1447 01/10/18 1500 01/10/18 1530  BP: 114/79 118/80 104/81 101/84  Pulse: (!) 137 (!) 136 (!) 139 (!) 137  Resp: (!) 23 (!) 26 18 (!) 21  Temp:      TempSrc:      SpO2: 98% 98% 98% 100%  Weight:      Height:       No intake or output data in the 24 hours ending 01/10/18 1612 Filed Weights   01/10/18 1307  Weight: 250 lb (113.4 kg)   Body mass index is 32.1 kg/m.  General:  Well nourished, well developed, in no acute distress HEENT: normal Lymph: no adenopathy Neck: no JVD Endocrine:  No thryomegaly Vascular: No carotid bruits; 4/4 extremity pulses 2+, without bruits  Cardiac:  normal S1, S2; rapid and  essentially regular rate and rhythm; no murmur  Lungs: Minimal wheeze with slightly decreased breath sounds bilaterally, no wheezing, rhonchi or rales  Abd: soft, nontender, no hepatomegaly  Ext: no edema Musculoskeletal:  No deformities, BUE and BLE strength normal  and equal Skin: warm and dry  Neuro:  CNs 2-12 intact, no focal abnormalities noted Psych:  Normal affect   EKG:  The EKG was personally reviewed and demonstrates:  05/17 course atrial fibrillation versus atypical atrial flutter, heart rate 185, no acute ischemic changes, possible LVH  Telemetry:  Telemetry was personally reviewed and demonstrates: Coarse atrial fib versus atypical atrial flutter, heart rate approximately 130.  Relevant CV Studies:  None  Laboratory Data:  Chemistry Recent Labs  Lab 01/10/18 1308  NA 143  K 3.8  CL 113*  CO2 19*  GLUCOSE 92  BUN 16  CREATININE 0.89  CALCIUM 9.0  GFRNONAA >60  GFRAA >60  ANIONGAP 11    Lab Results  Component Value Date   ALT 28 01/10/2018   AST 24 01/10/2018   ALKPHOS 93 01/10/2018   BILITOT 1.8 (H) 01/10/2018   Hematology Recent Labs  Lab 01/10/18 1308  WBC 7.8  RBC 5.17  HGB 14.2  HCT 43.6  MCV 84.3  MCH 27.5  MCHC 32.6  RDW 14.5  PLT 228   Cardiac Enzymes  Recent Labs  Lab 01/10/18 1312  TROPIPOC 0.02    TSH:  Lab Results  Component Value Date   TSH <0.010 (L) 01/10/2018   Magnesium:  Magnesium  Date Value Ref Range Status  01/10/2018 1.8 1.7 - 2.4 mg/dL Final    Comment:    Performed at [facility], [address] [place], [place] [address]     Radiology/Studies:  Dg Chest Port 1 View  Result Date: 01/10/2018 CLINICAL DATA:  Fluttering sensation in the chest for the past week. EXAM: PORTABLE CHEST 1 VIEW COMPARISON:  None. FINDINGS: Mild basilar atelectasis is seen. Heart size is upper normal. No pneumothorax or pleural effusion. No acute bony abnormality. IMPRESSION: No acute disease. Electronically  Signed   By: Drusilla Kanner M.D.   On: 01/10/2018 14:24    Assessment and Plan:   Principal Problem: 1.  Atrial fibrillation with rapid ventricular response (HCC) - likely 2nd hyperthyroidism - contributing factors are cocaine use, some caffeine use and ETOH/Tobacco use - counseled pt on lifestyle modifications - since he routinely does cocaine, continue Cardizem for now - would continue IV since HR not controlled - if BP will tolerate, ok to go to 20 mg/hr - would use BB such as labetalol or Coreg if pt agrees to quit cocaine - unknown duration, so cannot do DCCV w/out TEE - would keep on rx for now, consider TEE/DCCV next week if HR not controlled.  - ck echo  Active Problems:- 2.  Abnormal TSH - per IM, but have ordered free T3 and T4 as well as antibodies - new dx, never had before but no recent checkup.  3. Hx cocaine use - per pt, only on the weekends, ck drug screen  For questions or updates, please contact [facility] Please consult [email or web address] for contact info under Cardiology/STEMI.   Signed, Theodore Demark, PA-C  01/10/2018 4:12 PM   Patient examined chart reviewed. Discussed care with patient and PA. Patient admitted with rapid atrial fibrillation  In setting of hyperthyroidism and cocaine use. Also smokes and uses marijuana has had a 40 lb weight loss last 6 months. Some anxiety and "jitteryness" No chest pain Rapid palpitations today and went to ER.Snorts cocaine actively on weekends. Exam with overweight black male clear lungs no palpable thyroid nodule normal heart sounds. Trace edema and good pulses. ECG and telemetry with rapid afib no  pre excitation. On iv cardizem for rate control. Would start DOAC and continue until euthyroid. Unable to use beta blocker due to cocaine use. TTE to r/o structural heart disease Transition to PO cardizem in am. US thyroid check T4 and Free T3 will likely need endocrine f/u and be started on methimazole Discussed dangers of drug  use especially while hyperthyroid in regard to cardiac arrhythmias at length With patient.   Charlton Haws

## 2018-01-10 NOTE — ED Provider Notes (Signed)
[facility]-EMERGENCY DEPT Provider Note   CSN: [id number] Arrival date & time: 01/10/18  1236     History   Chief Complaint Chief Complaint  Patient presents with  . Palpitations    HPI Logan Meadows is a 37 y.o. male.  HPI Patient reports he started developing some fluttering in his chest and shortness of breath with exertion about a week ago.  It first started when he was working in the yard.  He thought it was just due to the heat and pollen.  Since that time symptoms have waxed and waned, usually with some activity.  Ports he had a lot of nasal congestion and drainage and so attributed all this to upper respiratory type symptoms.  He went in for a checkup today and it was identified that his heart rate was elevated.  He reports at that time he was also treated with an albuterol treatment for respiratory symptoms.  Heart rate was persistently elevated and he was then referred to the emergency department.  Patient denies that he is noticed similar symptoms for this week.  Reports he drinks about a 12 pack/week.  This is usually mostly on the weekend.  Patient reports smoking some marijuana.  Reports occasional cocaine use last used 2 weeks ago.  He denies he has any chronic medical problems.  He denies he has any regularly prescribed medications. Past Medical History:  Diagnosis Date  . Abnormal TSH 01/10/2018  . Atrial fibrillation with rapid ventricular response (HCC) 01/10/2018  . Seasonal allergies     Patient Active Problem List   Diagnosis Date Noted  . Atrial fibrillation with rapid ventricular response (HCC) 01/10/2018  . Abnormal TSH 01/10/2018          Home Medications    Prior to Admission medications   Medication Sig Start Date End Date Taking? Authorizing Provider  acetaminophen (TYLENOL) 500 MG tablet Take 500 mg by mouth every 6 (six) hours as needed.   Yes [provider]  cetirizine (ZYRTEC) 10 MG tablet Take 10 mg by  mouth daily.   Yes [provider]  fluticasone (FLONASE) 50 MCG/ACT nasal spray Place 2 sprays into both nostrils daily as needed for allergies or rhinitis.   Yes [provider]    Family History Family History  Problem Relation Age of Onset  . Diabetes Mother   . Diabetes Father   . Colon polyps Father     Social History Social History   Tobacco Use  . Smoking status: Current Every Day Smoker    Packs/day: 0.25  . Smokeless tobacco: Never Used  . Tobacco comment: 1 pack on the weekends  Substance Use Topics  . Alcohol use: Yes    Alcohol/week: 7.2 oz    Types: 12 Cans of beer per week    Comment: 12 pack per weekend  . Drug use: Yes    Frequency: 7.0 times per week    Types: Marijuana, Cocaine    Comment: daily marijuana, cocaine on weekends     Allergies   Patient has no known allergies.   Review of Systems Review of Systems 10 Systems reviewed and are negative for acute change except as noted in the HPI.   Physical Exam Updated Vital Signs BP 118/82   Pulse (!) 135   Temp 98.9 F (37.2 C) (Oral)   Resp 17   Ht 6\' 2"  (1.88 m)   Wt 113.4 kg (250 lb)   SpO2 98%   BMI  32.10 kg/m   Physical Exam  Constitutional: He is oriented to person, place, and time. He appears well-developed and well-nourished.  HENT:  Head: Normocephalic and atraumatic.  Eyes: Pupils are equal, round, and reactive to light. EOM are normal.  Neck: Neck supple.  Cardiovascular: Intact distal pulses.  Tachycardic.  Irregularly irregular.  Monitor shows narrow complex tachycardia ranging from 150s to 180s.  Pulmonary/Chest: Effort normal and breath sounds normal.  Abdominal: Soft. Bowel sounds are normal. He exhibits no distension. There is no tenderness.  Musculoskeletal: Normal range of motion. He exhibits no edema or tenderness.  Neurological: He is alert and oriented to person, place, and time. He has normal strength. No cranial nerve deficit. He exhibits normal  muscle tone. Coordination normal. GCS eye subscore is 4. GCS verbal subscore is 5. GCS motor subscore is 6.  Skin: Skin is warm, dry and intact.  Psychiatric: He has a normal mood and affect.     ED Treatments / Results  Labs (all labs ordered are listed, but only abnormal results are displayed) Labs Reviewed  COMPREHENSIVE METABOLIC PANEL - Abnormal; Notable for the following components:      Result Value   Chloride 113 (*)    CO2 19 (*)    Total Bilirubin 1.8 (*)    All other components within normal limits  PROTIME-INR - Abnormal; Notable for the following components:   Prothrombin Time 16.0 (*)    All other components within normal limits  TSH - Abnormal; Notable for the following components:   TSH <0.010 (*)    All other components within normal limits  ETHANOL  CBC WITH DIFFERENTIAL/PLATELET  MAGNESIUM  PHOSPHORUS  URINALYSIS, ROUTINE W REFLEX MICROSCOPIC  RAPID URINE DRUG SCREEN, HOSP PERFORMED  HEPARIN LEVEL (UNFRACTIONATED)  T3, FREE  T4, FREE  THYROID PEROXIDASE ANTIBODY  I-STAT TROPONIN, ED    EKG EKG Interpretation  Date/Time:  Friday Jan 10 2018 12:48:30 EDT Ventricular Rate:  185 PR Interval:    QRS Duration: 81 QT Interval:  329 QTC Calculation: 611 R Axis:   86 Text Interpretation:  Atrial fibrillation with rapid V-rate Consider left ventricular hypertrophy Anterior Q waves, possibly due to LVH Baseline wander in lead(s) V1 V2 V3 V6 Confirmed by Arby Barrette [phone]) on 01/10/2018 2:54:35 PM   Radiology Dg Chest Port 1 View  Result Date: 01/10/2018 CLINICAL DATA:  Fluttering sensation in the chest for the past week. EXAM: PORTABLE CHEST 1 VIEW COMPARISON:  None. FINDINGS: Mild basilar atelectasis is seen. Heart size is upper normal. No pneumothorax or pleural effusion. No acute bony abnormality. IMPRESSION: No acute disease. Electronically Signed   By: Drusilla Kanner M.D.   On: 01/10/2018 14:24    Procedures Procedures (including critical care  time) CRITICAL CARE Performed by: Cristy Friedlander   Total critical care time: 30 minutes  Critical care time was exclusive of separately billable procedures and treating other patients.  Critical care was necessary to treat or prevent imminent or life-threatening deterioration.  Critical care was time spent personally by me on the following activities: development of treatment plan with patient and/or surrogate as well as nursing, discussions with consultants, evaluation of patient's response to treatment, examination of patient, obtaining history from patient or surrogate, ordering and performing treatments and interventions, ordering and review of laboratory studies, ordering and review of radiographic studies, pulse oximetry and re-evaluation of patient's condition. Medications Ordered in ED Medications  diltiazem (CARDIZEM) 100 mg in dextrose 5% (1 mg/mL) infusion (15 mg/hr  Intravenous New Bag/Given 01/10/18 1447)  heparin ADULT infusion 100 units/mL (25000 units/287mL sodium chloride 0.45%) (1,500 Units/hr Intravenous New Bag/Given 01/10/18 1343)  diltiazem (CARDIZEM) 1 mg/mL load via infusion 20 mg (20 mg Intravenous Bolus from Bag 01/10/18 1318)  heparin bolus via infusion 4,000 Units (4,000 Units Intravenous Bolus from Bag 01/10/18 1346)  diltiazem (CARDIZEM) 1 mg/mL load via infusion 15 mg (15 mg Intravenous Bolus from Bag 01/10/18 1440)     Initial Impression / Assessment and Plan / ED Course  I have reviewed the triage vital signs and the nursing notes.  Pertinent labs & imaging results that were available during my care of the patient were reviewed by me and considered in my medical decision making (see chart for details).    Consult: Cardiology for evaluation in the emergency department and final disposition.  Final Clinical Impressions(s) / ED Diagnoses   Final diagnoses:  Atrial fibrillation with RVR (HCC)  New onset atrial fibrillation [facility])   Patient presents as  outlined above.  EKG shows atrial fibrillation with rapid ventricular response.  Rates on arrival were variable from 150s to 180s.  Symptoms have likely been present for at least a week's duration.  He denies he is been experiencing any significant chest pain but periodically fluttering sensation in his chest.  Does have been more pronounced with exertion.  Heparin initiated and Cardizem bolus and drip.  She is not exhibiting any respiratory distress.  He does not have active chest pain.  Will be for cardiology consultation.  At this time he does not appear to be a candidate for cardioversion due to duration of symptoms.  He is requiring IV maintenance for rate control.  Rate has improved into the 130s.  Patient feels subjectively better. ED Discharge Orders    None       Arby Barrette, MD 01/10/18 1625

## 2018-01-10 NOTE — ED Notes (Signed)
Pt informed a urine specimen is needed, pt responded "Uh, probably need some water.", urinal at bedside.

## 2018-01-10 NOTE — Progress Notes (Signed)
Brief Pharmacy Note re: IV Heparin  O: Heparin level = 0.39 on 1500 units/hr (goal 0.3-0.7)      CBC WNL      No bleeding or infusion related issues noted by RN  A/P: Continue heparin at current rate.  Repeat level with AM labs.     Junita Push, PharmD, BCPS 01/10/2018@9 :27 PM

## 2018-01-10 NOTE — Progress Notes (Signed)
ANTICOAGULATION CONSULT NOTE - Initial Consult  Pharmacy Consult for IV heparin Indication: atrial fibrillation  Allergies not on file  Patient Measurements: Height: 6\' 2"  (188 cm) Weight: 250 lb (113.4 kg) IBW/kg (Calculated) : 82.2 Heparin Dosing Weight: 105.8 kg  Vital Signs:    Labs: No results for input(s): HGB, HCT, PLT, APTT, LABPROT, INR, HEPARINUNFRC, HEPRLOWMOCWT, CREATININE, CKTOTAL, CKMB, TROPONINI in the last 72 hours.  CrCl cannot be calculated (No order found.).   Medical History: No past medical history on file.  Medications:  Scheduled:  . diltiazem  20 mg Intravenous Once   Infusions:  . diltiazem (CARDIZEM) infusion      Assessment: 37 yo male to ER started on diltiazem drip and now heparin drip per Rx. Baseline labs ordered.   Goal of Therapy:  Heparin level 0.3-0.7 units/ml Monitor platelets by anticoagulation protocol: Yes   Plan:  1) After baseline labs drawn, start IV heparin bolus of 4000 units then 2) IV heparin infusion rate of 1500 units/hr 3) Check heparin level 6 hours after start of IV heparin 4) Daily heparin level and CBC   Hessie Knows, PharmD, BCPS Pager [phone] 01/10/2018 1:10 PM

## 2018-01-10 NOTE — ED Triage Notes (Signed)
Pt reports fluttering sensation in his chest for the past week. Pt went to urgent care, had labs and EKG performed, was advised to come to ED.

## 2018-01-11 ENCOUNTER — Observation Stay (HOSPITAL_BASED_OUTPATIENT_CLINIC_OR_DEPARTMENT_OTHER): Payer: BLUE CROSS/BLUE SHIELD

## 2018-01-11 ENCOUNTER — Observation Stay (HOSPITAL_COMMUNITY): Payer: BLUE CROSS/BLUE SHIELD

## 2018-01-11 DIAGNOSIS — I4891 Unspecified atrial fibrillation: Secondary | ICD-10-CM | POA: Diagnosis present

## 2018-01-11 DIAGNOSIS — I420 Dilated cardiomyopathy: Secondary | ICD-10-CM | POA: Diagnosis not present

## 2018-01-11 DIAGNOSIS — I4892 Unspecified atrial flutter: Secondary | ICD-10-CM | POA: Diagnosis not present

## 2018-01-11 DIAGNOSIS — I340 Nonrheumatic mitral (valve) insufficiency: Secondary | ICD-10-CM

## 2018-01-11 DIAGNOSIS — J302 Other seasonal allergic rhinitis: Secondary | ICD-10-CM | POA: Diagnosis present

## 2018-01-11 DIAGNOSIS — Z6829 Body mass index (BMI) 29.0-29.9, adult: Secondary | ICD-10-CM | POA: Diagnosis not present

## 2018-01-11 DIAGNOSIS — E0590 Thyrotoxicosis, unspecified without thyrotoxic crisis or storm: Secondary | ICD-10-CM

## 2018-01-11 DIAGNOSIS — F1410 Cocaine abuse, uncomplicated: Secondary | ICD-10-CM | POA: Diagnosis present

## 2018-01-11 DIAGNOSIS — E785 Hyperlipidemia, unspecified: Secondary | ICD-10-CM | POA: Diagnosis not present

## 2018-01-11 DIAGNOSIS — Z87898 Personal history of other specified conditions: Secondary | ICD-10-CM | POA: Diagnosis not present

## 2018-01-11 DIAGNOSIS — I959 Hypotension, unspecified: Secondary | ICD-10-CM | POA: Diagnosis not present

## 2018-01-11 DIAGNOSIS — I5021 Acute systolic (congestive) heart failure: Secondary | ICD-10-CM | POA: Diagnosis present

## 2018-01-11 DIAGNOSIS — I313 Pericardial effusion (noninflammatory): Secondary | ICD-10-CM | POA: Diagnosis present

## 2018-01-11 DIAGNOSIS — E0500 Thyrotoxicosis with diffuse goiter without thyrotoxic crisis or storm: Secondary | ICD-10-CM | POA: Diagnosis present

## 2018-01-11 DIAGNOSIS — F17210 Nicotine dependence, cigarettes, uncomplicated: Secondary | ICD-10-CM | POA: Diagnosis present

## 2018-01-11 DIAGNOSIS — I483 Typical atrial flutter: Secondary | ICD-10-CM

## 2018-01-11 DIAGNOSIS — I428 Other cardiomyopathies: Secondary | ICD-10-CM | POA: Diagnosis present

## 2018-01-11 DIAGNOSIS — Z8371 Family history of colonic polyps: Secondary | ICD-10-CM | POA: Diagnosis not present

## 2018-01-11 DIAGNOSIS — R002 Palpitations: Secondary | ICD-10-CM | POA: Diagnosis present

## 2018-01-11 DIAGNOSIS — F1210 Cannabis abuse, uncomplicated: Secondary | ICD-10-CM | POA: Diagnosis present

## 2018-01-11 DIAGNOSIS — I429 Cardiomyopathy, unspecified: Secondary | ICD-10-CM

## 2018-01-11 DIAGNOSIS — Z7151 Drug abuse counseling and surveillance of drug abuser: Secondary | ICD-10-CM | POA: Diagnosis not present

## 2018-01-11 DIAGNOSIS — I484 Atypical atrial flutter: Secondary | ICD-10-CM | POA: Diagnosis present

## 2018-01-11 DIAGNOSIS — Z79899 Other long term (current) drug therapy: Secondary | ICD-10-CM | POA: Diagnosis not present

## 2018-01-11 DIAGNOSIS — E663 Overweight: Secondary | ICD-10-CM | POA: Diagnosis present

## 2018-01-11 DIAGNOSIS — Z833 Family history of diabetes mellitus: Secondary | ICD-10-CM | POA: Diagnosis not present

## 2018-01-11 LAB — COMPREHENSIVE METABOLIC PANEL
ALBUMIN: 3.2 g/dL — AB (ref 3.5–5.0)
ALT: 27 U/L (ref 17–63)
ANION GAP: 11 (ref 5–15)
AST: 21 U/L (ref 15–41)
Alkaline Phosphatase: 82 U/L (ref 38–126)
BUN: 12 mg/dL (ref 6–20)
CO2: 20 mmol/L — ABNORMAL LOW (ref 22–32)
Calcium: 8.8 mg/dL — ABNORMAL LOW (ref 8.9–10.3)
Chloride: 111 mmol/L (ref 101–111)
Creatinine, Ser: 0.84 mg/dL (ref 0.61–1.24)
GFR calc non Af Amer: >60 mL/min (ref >60)
GLUCOSE: 110 mg/dL — AB (ref 65–99)
POTASSIUM: 3.7 mmol/L (ref 3.5–5.1)
SODIUM: 142 mmol/L (ref 135–145)
TOTAL PROTEIN: 5.9 g/dL — AB (ref 6.5–8.1)
Total Bilirubin: 1.9 mg/dL — ABNORMAL HIGH (ref 0.3–1.2)

## 2018-01-11 LAB — TROPONIN I
Troponin I: 0.03 ng/mL (ref <0.03)
Troponin I: 0.03 ng/mL (ref <0.03)

## 2018-01-11 LAB — PHOSPHORUS: PHOSPHORUS: 3.6 mg/dL (ref 2.5–4.6)

## 2018-01-11 LAB — THYROID PEROXIDASE ANTIBODY: THYROID PEROXIDASE ANTIBODY: 12 [IU]/mL (ref 0–34)

## 2018-01-11 LAB — CBC
HEMATOCRIT: 40.8 % (ref 39.0–52.0)
Hemoglobin: 13.5 g/dL (ref 13.0–17.0)
MCH: 28.1 pg (ref 26.0–34.0)
MCHC: 33.1 g/dL (ref 30.0–36.0)
MCV: 84.8 fL (ref 78.0–100.0)
PLATELETS: 225 10*3/uL (ref 150–400)
RBC: 4.81 MIL/uL (ref 4.22–5.81)
RDW: 14.4 % (ref 11.5–15.5)
WBC: 8.6 10*3/uL (ref 4.0–10.5)

## 2018-01-11 LAB — TSH: TSH: <0.01 u[IU]/mL — ABNORMAL LOW (ref 0.350–4.500)

## 2018-01-11 LAB — HEPARIN LEVEL (UNFRACTIONATED): HEPARIN UNFRACTIONATED: 0.31 [IU]/mL (ref 0.30–0.70)

## 2018-01-11 LAB — ECHOCARDIOGRAM COMPLETE
Height: 74 in
WEIGHTICAEL: 3957.7 [oz_av]

## 2018-01-11 LAB — T3, FREE: T3 FREE: 8.8 pg/mL — AB (ref 2.0–4.4)

## 2018-01-11 LAB — HEMOGLOBIN A1C
Hgb A1c MFr Bld: 5.4 % (ref 4.8–5.6)
MEAN PLASMA GLUCOSE: 108.28 mg/dL

## 2018-01-11 LAB — BRAIN NATRIURETIC PEPTIDE: B Natriuretic Peptide: 209 pg/mL — ABNORMAL HIGH (ref 0.0–100.0)

## 2018-01-11 LAB — MAGNESIUM: MAGNESIUM: 1.7 mg/dL (ref 1.7–2.4)

## 2018-01-11 MED ORDER — LORATADINE 10 MG PO TABS
10.0000 mg | ORAL_TABLET | Freq: Every day | ORAL | Status: DC
  Administered 2018-01-11 – 2018-01-19 (×9): 10 mg via ORAL
  Filled 2018-01-11 (×9): qty 1

## 2018-01-11 MED ORDER — IPRATROPIUM BROMIDE 0.02 % IN SOLN: 0.5000 mg | RESPIRATORY_TRACT | Status: DC | PRN

## 2018-01-11 MED ORDER — FLUTICASONE PROPIONATE 50 MCG/ACT NA SUSP
2.0000 | Freq: Every day | NASAL | Status: DC
  Filled 2018-01-11: qty 16

## 2018-01-11 MED ORDER — MAGNESIUM SULFATE 4 GM/100ML IV SOLN
4.0000 g | Freq: Once | INTRAVENOUS | Status: AC
  Administered 2018-01-11: 4 g via INTRAVENOUS
  Filled 2018-01-11: qty 100

## 2018-01-11 MED ORDER — FUROSEMIDE 10 MG/ML IJ SOLN
20.0000 mg | Freq: Two times a day (BID) | INTRAMUSCULAR | Status: DC
  Administered 2018-01-11 – 2018-01-13 (×4): 20 mg via INTRAVENOUS
  Filled 2018-01-11 (×4): qty 2

## 2018-01-11 MED ORDER — CARVEDILOL 3.125 MG PO TABS
3.1250 mg | ORAL_TABLET | Freq: Two times a day (BID) | ORAL | Status: DC
  Administered 2018-01-11 (×2): 3.125 mg via ORAL
  Filled 2018-01-11 (×2): qty 1

## 2018-01-11 MED ORDER — LEVALBUTEROL HCL 0.63 MG/3ML IN NEBU: 0.6300 mg | INHALATION_SOLUTION | RESPIRATORY_TRACT | Status: DC | PRN

## 2018-01-11 MED ORDER — HEPARIN (PORCINE) IN NACL 100-0.45 UNIT/ML-% IJ SOLN
1600.0000 [IU]/h | INTRAMUSCULAR | Status: DC
  Administered 2018-01-11: 1600 [IU]/h via INTRAVENOUS
  Filled 2018-01-11: qty 250

## 2018-01-11 MED ORDER — POTASSIUM CHLORIDE CRYS ER 20 MEQ PO TBCR
40.0000 meq | EXTENDED_RELEASE_TABLET | Freq: Once | ORAL | Status: AC
Start: 2018-01-11 — End: 2018-01-11
  Administered 2018-01-11: 40 meq via ORAL
  Filled 2018-01-11: qty 2

## 2018-01-11 MED ORDER — MORPHINE SULFATE (PF) 4 MG/ML IV SOLN
2.0000 mg | Freq: Once | INTRAVENOUS | Status: AC
  Administered 2018-01-11: 2 mg via INTRAVENOUS
  Filled 2018-01-11: qty 1

## 2018-01-11 NOTE — Progress Notes (Signed)
Patient complained of Chest heaviness and SOB. Heart rate remains in the 130's, sats had decreased to 02 on room air.  Oxygen placed and sats increased to 97 on 2 liters. Pt continued to complain of sob and heaviness in chest. EKG obtained. Results on print out was prolonged QTc and Acute MI STEMI, last Troponin was 0.04, Bodenheimer, NP and Dr. Darrick Penna notified, both looked at EKG, Bodenheimer ordered Morphine 2mg  iv x 1 which was given. At post Morphine, BP remained stable, patient states chest did not feel any different. Will continue to monitor and report changes. Erick Colace, RN-C

## 2018-01-11 NOTE — Progress Notes (Signed)
PT Cancellation Note  Patient Details Name: Logan Meadows MRN: [id number] DOB: 27-May-1981   Cancelled Treatment:    Reason Eval/Treat Not Completed: Medical issues which prohibited therapy: HR still elevated at rest: 139-140. Will hold PT for today.    Rebeca Alert, MPT Pager: [phone]

## 2018-01-11 NOTE — Progress Notes (Signed)
ANTICOAGULATION CONSULT NOTE Pharmacy Consult for IV heparin Indication: atrial fibrillation  No Known Allergies  Patient Measurements: Height: 6\' 2"  (188 cm) Weight: 247 lb 5.7 oz (112.2 kg) IBW/kg (Calculated) : 82.2 Heparin Dosing Weight: 105.8 kg  Vital Signs: Temp: 97.9 F (36.6 C) (05/18 0800) Temp Source: Oral (05/18 0800) BP: 130/98 (05/18 0800) Pulse Rate: 131 (05/18 0800)  Labs: Recent Labs    01/10/18 1308 01/10/18 2049 01/11/18 0216  HGB 14.2  --  13.5  HCT 43.6  --  40.8  PLT 228  --  225  LABPROT 16.0*  --   --   INR 1.29  --   --   HEPARINUNFRC  --  0.39 0.31  CREATININE 0.89  --  0.84  TROPONINI  --  0.04* 0.03*    Estimated Creatinine Clearance: 162 mL/min (by C-G formula based on SCr of 0.84 mg/dL).  Assessment: 37 yo male to ER on heparin drip per Rx for A flutter . Baseline labs ordered.  01/11/2018  Heparin level therapeutic at 0.31 on rate of 1500 units/hr.  CBC stable, no bleeding reported.   Goal of Therapy:  Heparin level 0.3-0.7 units/ml Monitor platelets by anticoagulation protocol: Yes   Plan:   increase IV heparin infusion rate to 1600 units/hr to keep in tx range  Daily heparin level and CBC Plan to transition to apixaban later per cards note  Herby Abraham, Pharm.D. [phone] 01/11/2018 8:53 AM

## 2018-01-11 NOTE — Progress Notes (Signed)
  Echocardiogram 2D Echocardiogram has been performed.  Leta Jungling M 01/11/2018, 8:02 AM

## 2018-01-11 NOTE — Progress Notes (Signed)
Progress Note  Patient Name: Logan Meadows Date of Encounter: 01/11/2018  Primary Cardiologist: Dr Eden Emms  Subjective   With mild dyspnea.  No chest pain.  Mild palpitations.  Inpatient Medications    Scheduled Meds: . folic acid  1 mg Oral Daily  . multivitamin with minerals  1 tablet Oral Daily  . potassium chloride  40 mEq Oral Once  . thiamine  100 mg Oral Daily   Or  . thiamine  100 mg Intravenous Daily   Continuous Infusions: . diltiazem (CARDIZEM) infusion 15 mg/hr (01/11/18 0645)  . heparin 1,500 Units/hr (01/11/18 0645)  . magnesium sulfate 1 - 4 g bolus IVPB     PRN Meds: acetaminophen **OR** acetaminophen, LORazepam **OR** LORazepam, ondansetron **OR** ondansetron (ZOFRAN) IV   Vital Signs    Vitals:   01/11/18 0300 01/11/18 0400 01/11/18 0502 01/11/18 0600  BP: 114/86  94/72 112/78  Pulse: (!) 59  (!) 132 (!) 132  Resp: 20  (!) 23 19  Temp:  98.5 F (36.9 C)    TempSrc:  Oral    SpO2: 95%  93% 97%  Weight:      Height:        Intake/Output Summary (Last 24 hours) at 01/11/2018 0752 Last data filed at 01/11/2018 0645 Gross per 24 hour  Intake 1699.67 ml  Output 300 ml  Net 1399.67 ml   Filed Weights   01/10/18 1307 01/10/18 2037  Weight: 250 lb (113.4 kg) 247 lb 5.7 oz (112.2 kg)    Telemetry    Atrial flutter with elevated rate- Personally Reviewed  Physical Exam   GEN: No acute distress.   Neck: No JVD Cardiac: irregular and tachycardic Respiratory: Clear to auscultation bilaterally. GI: Soft, nontender, non-distended  MS: No edema Neuro:  Nonfocal  Psych: Normal affect   Labs    Chemistry Recent Labs  Lab 01/10/18 1308 01/11/18 0216  NA 143 142  K 3.8 3.7  CL 113* 111  CO2 19* 20*  GLUCOSE 92 110*  BUN 16 12  CREATININE 0.89 0.84  CALCIUM 9.0 8.8*  PROT 6.5 5.9*  ALBUMIN 3.6 3.2*  AST 24 21  ALT 28 27  ALKPHOS 93 82  BILITOT 1.8* 1.9*  GFRNONAA >60 >60  GFRAA >60 >60  ANIONGAP 11 11      Hematology Recent Labs  Lab 01/10/18 1308 01/11/18 0216  WBC 7.8 8.6  RBC 5.17 4.81  HGB 14.2 13.5  HCT 43.6 40.8  MCV 84.3 84.8  MCH 27.5 28.1  MCHC 32.6 33.1  RDW 14.5 14.4  PLT 228 225    Cardiac Enzymes Recent Labs  Lab 01/10/18 2049 01/11/18 0216  TROPONINI 0.04* 0.03*    Recent Labs  Lab 01/10/18 1312  TROPIPOC 0.02     Radiology    Dg Chest Port 1 View  Result Date: 01/10/2018 CLINICAL DATA:  Fluttering sensation in the chest for the past week. EXAM: PORTABLE CHEST 1 VIEW COMPARISON:  None. FINDINGS: Mild basilar atelectasis is seen. Heart size is upper normal. No pneumothorax or pleural effusion. No acute bony abnormality. IMPRESSION: No acute disease. Electronically Signed   By: Drusilla Kanner M.D.   On: 01/10/2018 14:24    Patient Profile     37 y.o. male admitted with atrial flutter with rapid ventricular response.  Also noted to have hyperthyroidism.  Preliminary echocardiogram today shows reduced LV function as well.  Assessment & Plan    1 atrial flutter-patient remains in atrial flutter today.  His rate is elevated.  He is on Cardizem for rate control as he has a history of cocaine use.  However ideally he would receive a beta-blocker given reduced LV function and in particular in setting of hyperthyroidism.  I will add carvedilol 3.125 mg twice daily to see if he tolerates.  We will advance as tolerated and wean Cardizem to off.  Could also add digoxin as needed.  Continue heparin for now.  Transition to apixaban later.  Would plan cardioversion but would need to correct hyperthyroidism first. CHADSvasc 1 for reduced LV function.   2 cardiomyopathy-preliminary echocardiogram shows reduced LV function.  I will review for finalized report later today.  Cannot add ARB as blood pressure is borderline.  I will add carvedilol and increase as tolerated.  Wean Cardizem as tolerated with addition of Coreg.    3 hyperthyroidism-This is the primary issue.   Patient has symptoms of weight loss, heat intolerance.  Further treatment per primary service.  4 cocaine abuse-patient counseled on discontinuing.  For questions or updates, please contact [facility] Please consult [email or web address] for contact info under Cardiology/STEMI.      Signed, Olga Millers, MD  01/11/2018, 7:52 AM

## 2018-01-11 NOTE — Progress Notes (Addendum)
PROGRESS NOTE    Logan Meadows  ZOX:[id number] DOB: 1980-11-12 DOA: 01/10/2018 PCP: Patient, No Pcp Per    Brief Narrative:  37 year old male history of allergies, history of cocaine and marijuana use presented to the ED with palpitations, diaphoresis, diarrhea, weight loss x3 weeks and noted at the urgent care center to be tachycardic and in A. fib.  Patient sent to the ED noted to be in A. fib/a flutter placed on a Cardizem drip and noted to be significantly hyperthyroidism..   Assessment & Plan:   Principal Problem:   Atrial fibrillation with rapid ventricular response (HCC) Active Problems:   Hyperthyroidism   Hx of cocaine abuse   Atrial fibrillation with RVR (HCC)   Cardiomyopathy (HCC)  #1 A. fib with RVR CHA2DS2VASC = 1 Likely secondary to primary hypothyroidism.  Patient still tachycardic on Cardizem drip with heart rate in the 130s.  Patient with some chest tightness.  Troponins minimally elevated at 0.03.  2D echo done results pending however per cardiology patient with reduced LV function.  Check a fasting lipid panel.  Check a hemoglobin A1c.  Coreg added to patient's regimen for better rate control.  Importance of cessation of cocaine abuse stressed to patient who has agreed to stop using cocaine.  On IV heparin.Will likely be transitioned to NOAC.  Keep magnesium greater than 2.  Keep potassium greater than 4.  Per cardiology.  #2.  Hyperthyroidism Stable etiology.  Concern for possible Graves' disease versus thyroiditis.  TSH was less than 0.010.  Free T4 was 4.13.  Free T3 was 8.8.  Thyroid reuptake scan has been ordered and currently pending.  Thyrotropin receptor antibodies pending.  Thyroid peroxidase antibody pending.  Will need to wait until thyroid reuptake scan has been completed prior to starting patient on methimazole 10 mg 3 times daily during this hospitalization and subsequently transitioned to methimazole 30 mg daily on discharge.  Will need outpatient  follow-up with endocrinology.  Case discussed with Dr. Sharl Ma of endocrinology.  #3 cardiomyopathy Per cardiology preliminary echocardiogram with reduced left ventricular function.  Final report pending.  Check a fasting lipid panel.  Check a hemoglobin A1c.  Patient currently on Cardizem.  Patient started on Coreg per cardiology.  Cardiology following.  #4.  Cocaine abuse Cocaine cessation stressed to patient.  Patient stated he would rather live.     DVT prophylaxis: Heparin Code Status: Full Family Communication: Updated patient.  No family at bedside. Disposition Plan: Remain in the stepdown unit.  Likely home once clinically improved and medically stable.   Consultants:   Cardiology: Dr. Eden Emms 01/10/2018  Procedures:   2D echo pending 01/11/2018  Chest x-ray 01/10/2018  Antimicrobials:   None   Subjective: Showing some complaints of chest tightness.  Patient states some shortness of breath when trying to sleep.  Feels better than on admission.  No nausea or vomiting.  Objective: Vitals:   01/11/18 0600 01/11/18 0800 01/11/18 0900 01/11/18 1000  BP: 112/78 (!) 130/98 125/81 128/88  Pulse: (!) 132 (!) 131 (!) 136 (!) 140  Resp: 19 (!) 25 (!) 26 (!) 32  Temp:  97.9 F (36.6 C)    TempSrc:  Oral    SpO2: 97% 95% 95% 92%  Weight:      Height:        Intake/Output Summary (Last 24 hours) at 01/11/2018 1036 Last data filed at 01/11/2018 1005 Gross per 24 hour  Intake 2344.67 ml  Output 900 ml  Net 1444.67 ml  Filed Weights   01/10/18 1307 01/10/18 2037  Weight: 113.4 kg (250 lb) 112.2 kg (247 lb 5.7 oz)    Examination:  General exam: Appears calm and comfortable Respiratory system: Bibasilar crackles. No wheezing. No rhonchi. Respiratory effort normal. Cardiovascular system: Irregularly irregular.  No JVD.  No murmurs, rubs or gallops.  No pedal edema.  Gastrointestinal system: Abdomen is nondistended, soft and nontender. No organomegaly or masses felt.  Normal bowel sounds heard. Central nervous system: Alert and oriented. No focal neurological deficits. Extremities: Symmetric 5 x 5 power. Skin: No rashes, lesions or ulcers Psychiatry: Judgement and insight appear normal. Mood & affect appropriate.     Data Reviewed: I have personally reviewed following labs and imaging studies  CBC: Recent Labs  Lab 01/10/18 1308 01/11/18 0216  WBC 7.8 8.6  NEUTROABS 4.5  --   HGB 14.2 13.5  HCT 43.6 40.8  MCV 84.3 84.8  PLT 228 225   Basic Metabolic Panel: Recent Labs  Lab 01/10/18 1308 01/11/18 0216  NA 143 142  K 3.8 3.7  CL 113* 111  CO2 19* 20*  GLUCOSE 92 110*  BUN 16 12  CREATININE 0.89 0.84  CALCIUM 9.0 8.8*  MG 1.8 1.7  PHOS 3.5 3.6   GFR: Estimated Creatinine Clearance: 162 mL/min (by C-G formula based on SCr of 0.84 mg/dL). Liver Function Tests: Recent Labs  Lab 01/10/18 1308 01/11/18 0216  AST 24 21  ALT 28 27  ALKPHOS 93 82  BILITOT 1.8* 1.9*  PROT 6.5 5.9*  ALBUMIN 3.6 3.2*   No results for input(s): LIPASE, AMYLASE in the last 168 hours. No results for input(s): AMMONIA in the last 168 hours. Coagulation Profile: Recent Labs  Lab 01/10/18 1308  INR 1.29   Cardiac Enzymes: Recent Labs  Lab 01/10/18 2049 01/11/18 0216 01/11/18 0806  TROPONINI 0.04* 0.03* 0.03*   BNP (last 3 results) No results for input(s): PROBNP in the last 8760 hours. HbA1C: Recent Labs    01/11/18 0216  HGBA1C 5.4   CBG: No results for input(s): GLUCAP in the last 168 hours. Lipid Profile: No results for input(s): CHOL, HDL, LDLCALC, TRIG, CHOLHDL, LDLDIRECT in the last 72 hours. Thyroid Function Tests: Recent Labs    01/10/18 1830 01/10/18 1831 01/11/18 0216  TSH  --   --  <0.010*  FREET4  --  4.13*  --   T3FREE 8.8*  --   --    Anemia Panel: No results for input(s): VITAMINB12, FOLATE, FERRITIN, TIBC, IRON, RETICCTPCT in the last 72 hours. Sepsis Labs: Recent Labs  Lab 01/10/18 2049  LATICACIDVEN 1.2      Recent Results (from the past 240 hour(s))  MRSA PCR Screening     Status: None   Collection Time: 01/10/18  8:31 PM  Result Value Ref Range Status   MRSA by PCR NEGATIVE NEGATIVE Final    Comment:        The GeneXpert MRSA Assay (FDA approved for NASAL specimens only), is one component of a comprehensive MRSA colonization surveillance program. It is not intended to diagnose MRSA infection nor to guide or monitor treatment for MRSA infections. Performed at [facility], [address], [place], [place] [address]          Radiology Studies: Dg Chest Port 1 View  Result Date: 01/10/2018 CLINICAL DATA:  Fluttering sensation in the chest for the past week. EXAM: PORTABLE CHEST 1 VIEW COMPARISON:  None. FINDINGS: Mild basilar atelectasis is seen. Heart size  is upper normal. No pneumothorax or pleural effusion. No acute bony abnormality. IMPRESSION: No acute disease. Electronically Signed   By: Drusilla Kanner M.D.   On: 01/10/2018 14:24        Scheduled Meds: . carvedilol  3.125 mg Oral BID WC  . fluticasone  2 spray Each Nare Daily  . folic acid  1 mg Oral Daily  . loratadine  10 mg Oral Daily  . multivitamin with minerals  1 tablet Oral Daily  . thiamine  100 mg Oral Daily   Or  . thiamine  100 mg Intravenous Daily   Continuous Infusions: . diltiazem (CARDIZEM) infusion 15 mg/hr (01/11/18 1005)  . heparin 1,600 Units/hr (01/11/18 1005)  . magnesium sulfate 1 - 4 g bolus IVPB 4 g (01/11/18 0843)     LOS: 0 days    Time spent: 45 mins    Ramiro Harvest, MD [facility] Pager [phone]  If 7PM-7AM, please contact night-coverage [email or web address] Password [facility] 01/11/2018, 10:36 AM

## 2018-01-12 DIAGNOSIS — I4892 Unspecified atrial flutter: Secondary | ICD-10-CM | POA: Diagnosis present

## 2018-01-12 DIAGNOSIS — I420 Dilated cardiomyopathy: Secondary | ICD-10-CM

## 2018-01-12 LAB — CBC
HCT: 39.3 % (ref 39.0–52.0)
HEMOGLOBIN: 13 g/dL (ref 13.0–17.0)
MCH: 27.8 pg (ref 26.0–34.0)
MCHC: 33.1 g/dL (ref 30.0–36.0)
MCV: 84 fL (ref 78.0–100.0)
Platelets: 200 10*3/uL (ref 150–400)
RBC: 4.68 MIL/uL (ref 4.22–5.81)
RDW: 14.5 % (ref 11.5–15.5)
WBC: 8 10*3/uL (ref 4.0–10.5)

## 2018-01-12 LAB — LIPID PANEL
CHOLESTEROL: 108 mg/dL (ref 0–200)
HDL: 32 mg/dL — AB (ref >40)
LDL Cholesterol: 67 mg/dL (ref 0–99)
TRIGLYCERIDES: 47 mg/dL (ref <150)
Total CHOL/HDL Ratio: 3.4 RATIO
VLDL: 9 mg/dL (ref 0–40)

## 2018-01-12 LAB — BASIC METABOLIC PANEL
ANION GAP: 10 (ref 5–15)
BUN: 12 mg/dL (ref 6–20)
CHLORIDE: 110 mmol/L (ref 101–111)
CO2: 18 mmol/L — AB (ref 22–32)
Calcium: 8.8 mg/dL — ABNORMAL LOW (ref 8.9–10.3)
Creatinine, Ser: 0.82 mg/dL (ref 0.61–1.24)
GFR calc non Af Amer: >60 mL/min (ref >60)
Glucose, Bld: 111 mg/dL — ABNORMAL HIGH (ref 65–99)
Potassium: 3.7 mmol/L (ref 3.5–5.1)
Sodium: 138 mmol/L (ref 135–145)

## 2018-01-12 LAB — HEMOGLOBIN A1C
Hgb A1c MFr Bld: 5.4 % (ref 4.8–5.6)
Mean Plasma Glucose: 108.28 mg/dL

## 2018-01-12 LAB — MAGNESIUM: MAGNESIUM: 1.8 mg/dL (ref 1.7–2.4)

## 2018-01-12 LAB — THYROTROPIN RECEPTOR AUTOABS: THYROTROPIN RECEPTOR AB: 1 IU/L (ref 0.00–1.75)

## 2018-01-12 LAB — HEPARIN LEVEL (UNFRACTIONATED)
HEPARIN UNFRACTIONATED: 0.26 [IU]/mL — AB (ref 0.30–0.70)
HEPARIN UNFRACTIONATED: 0.22 [IU]/mL — AB (ref 0.30–0.70)

## 2018-01-12 MED ORDER — HEPARIN (PORCINE) IN NACL 100-0.45 UNIT/ML-% IJ SOLN
2000.0000 [IU]/h | INTRAMUSCULAR | Status: DC
  Administered 2018-01-12 (×2): 2000 [IU]/h via INTRAVENOUS
  Filled 2018-01-12: qty 250

## 2018-01-12 MED ORDER — HEPARIN (PORCINE) IN NACL 100-0.45 UNIT/ML-% IJ SOLN
1800.0000 [IU]/h | INTRAMUSCULAR | Status: DC
  Administered 2018-01-12: 1800 [IU]/h via INTRAVENOUS
  Filled 2018-01-12: qty 250

## 2018-01-12 MED ORDER — CARVEDILOL 6.25 MG PO TABS
6.2500 mg | ORAL_TABLET | Freq: Two times a day (BID) | ORAL | Status: DC
  Administered 2018-01-12 – 2018-01-13 (×3): 6.25 mg via ORAL
  Filled 2018-01-12 (×3): qty 1

## 2018-01-12 MED ORDER — MAGNESIUM SULFATE 4 GM/100ML IV SOLN
4.0000 g | Freq: Once | INTRAVENOUS | Status: AC
  Administered 2018-01-12: 4 g via INTRAVENOUS
  Filled 2018-01-12: qty 100

## 2018-01-12 MED ORDER — HEPARIN BOLUS VIA INFUSION
2000.0000 [IU] | Freq: Once | INTRAVENOUS | Status: AC
  Administered 2018-01-12: 2000 [IU] via INTRAVENOUS
  Filled 2018-01-12: qty 2000

## 2018-01-12 NOTE — Progress Notes (Signed)
ANTICOAGULATION CONSULT NOTE Pharmacy Consult for IV heparin Indication: atrial fibrillation  No Known Allergies  Patient Measurements: Height: 6\' 2"  (188 cm) Weight: 248 lb 7.3 oz (112.7 kg) IBW/kg (Calculated) : 82.2 Heparin Dosing Weight: 105.8 kg  Vital Signs: Temp: 98.9 F (37.2 C) (05/19 0800) Temp Source: Oral (05/19 0800) BP: 84/71 (05/19 1100) Pulse Rate: 65 (05/19 1100)  Labs: Recent Labs    01/10/18 1308  01/10/18 2049 01/11/18 0216 01/11/18 0806 01/12/18 0319 01/12/18 1240  HGB 14.2  --   --  13.5  --  13.0  --   HCT 43.6  --   --  40.8  --  39.3  --   PLT 228  --   --  225  --  200  --   LABPROT 16.0*  --   --   --   --   --   --   INR 1.29  --   --   --   --   --   --   HEPARINUNFRC  --    < > 0.39 0.31  --  0.26* 0.22*  CREATININE 0.89  --   --  0.84  --  0.82  --   TROPONINI  --   --  0.04* 0.03* 0.03*  --   --    < > = values in this interval not displayed.    Estimated Creatinine Clearance: 166.3 mL/min (by C-G formula based on SCr of 0.82 mg/dL).  Assessment: 37 yo male to ER on heparin drip per Rx for A flutter . Baseline labs ordered.  01/12/2018  0319 HL= 0.26 below goal, no bleeding or infusion issues per RN. CBC stable, no bleeding reported 1240 HL = 0.22 remain below goal after heparin rate increase from 1600 to 1800 units/hr- per RN no interruptions in infusion  Goal of Therapy:  Heparin level 0.3-0.7 units/ml Monitor platelets by anticoagulation protocol: Yes   Plan:  Bolus 2000 units IV x 1 then increase IV heparin infusion rate to 2000 units/hr Recheck HL in 6 hours  Daily heparin level and CBC  Plan to transition to apixaban later per cards note  Herby Abraham, Pharm.D. [phone] 01/12/2018 2:18 PM

## 2018-01-12 NOTE — Progress Notes (Signed)
PROGRESS NOTE    BRISTON LAX  FAO:[id number] DOB: [birth year] DOA: 01/10/2018 PCP: Patient, No Pcp Per    Brief Narrative:  37 year old male history of allergies, history of cocaine and marijuana use presented to the ED with palpitations, diaphoresis, diarrhea, weight loss x3 weeks and noted at the urgent care center to be tachycardic and in A. fib.  Patient sent to the ED noted to be in A. fib/a flutter placed on a Cardizem drip and noted to be significantly hyperthyroidism.. Patient also noted to be in acute systolic heart failure.  Patient placed on IV diuretics and beta-blocker added to patient's regimen.   Assessment & Plan:   Principal Problem:   Atrial flutter with rapid ventricular response (HCC) Active Problems:   Atrial fibrillation with rapid ventricular response (HCC)   Hyperthyroidism   Acute systolic (congestive) heart failure (HCC)   Hx of cocaine abuse   Atrial fibrillation with RVR (HCC)   Cardiomyopathy (HCC)  #1  Atrial flutter /A. fib CHA2DS2VASC = 1 Likely secondary to primary hyperthyroidism.  Patient still tachycardic on Cardizem drip with heart rate in the 130s.  Improvement with chest tightness with diuresis.  Troponins minimally elevated at 0.03.  2D echo with a EF of 25 to 30% with diffuse hypokinesis, severe global reduction in left ventricular systolic function, four-chamber enlargement, mild MR and TR, small pericardial effusion.  LDL of 67.  Hemoglobin A1c of 5.4.  Coreg dose has been increased to 6.25 mg twice daily per cardiology.  Importance of cessation of cocaine abuse stressed to patient who has agreed to stop using cocaine.  On IV heparin. Will likely be transitioned to NOAC however will defer timing to cardiology.  Keep magnesium greater than 2.  Keep potassium greater than 4.  Per cardiology.  #2.  Hyperthyroidism Stable etiology.  Patient is afebrile, alert and oriented with no confusion, no nausea or vomiting, no abdominal pain.  Patient  noting a thyroid storm at this time.  Concern for possible Graves' disease versus thyroiditis.  TSH was less than 0.010.  Free T4 was 4.13.  Free T3 was 8.8.  Thyroid reuptake scan has been ordered and currently pending.  Thyrotropin receptor antibodies pending.  Thyroid peroxidase antibody pending.  We will check a morning cortisol level at 7 AM tomorrow morning.  Will need to wait until thyroid reuptake scan has been completed prior to starting patient on methimazole 10 mg 3 times daily during this hospitalization and subsequently transitioned to methimazole 30 mg daily on discharge.  Currently no need for steroids at this time as patient noted a thyroid storm.  Will need outpatient follow-up with endocrinology.  Case discussed with Dr. Sharl Ma of endocrinology.  #3 cardiomyopathy  2D echo with a EF of 25 to 30% with diffuse hypokinesis, severe global reduction in left ventricular systolic function, four-chamber enlargement, mild MR and TR, small pericardial effusion.  Fasting lipid panel with a LDL of 67.  Hemoglobin A1c 5.4.  Continue beta-blocker. Cardiology following.  #4.  Cocaine abuse Cocaine cessation stressed to patient.  Patient stated he would rather live.  5.  Acute systolic heart failure Likely secondary to a flutter.  Patient yesterday was complaining of some chest tightness on ambulation and choppy sentences on ambulation with some shortness of breath on minimal exertion.  BNP obtained was 209.  Chest x-ray obtained was consistent with volume overload.  2D echo with a EF of 25 to 30% with diffuse hypokinesis, severe global reduction in left ventricular systolic  function, four-chamber enlargement, mild MR and TR, small pericardial effusion.  LDL is 67.  Patient started on Lasix 20 mg IV every 12 hours.  Patient with a urine output of 2.950 L overnight.  Current weight is 248 pounds from 250 pounds on admission.  Continue Lasix 20 mg IV every 12 hours, beta-blocker, heparin.  Strict I's and O's.   Daily weights.  Cardiology following.       DVT prophylaxis: Heparin Code Status: Full Family Communication: Updated patient.  No family at bedside. Disposition Plan: Remain in the stepdown unit.  Likely home once clinically improved and medically stable.   Consultants:   Cardiology: Dr. Eden Emms 01/10/2018  Procedures:   2D echo 01/11/2018  Chest x-ray 01/10/2018  Antimicrobials:   None   Subjective: Patient just got back from ambulating in the hall with physical therapy.  Patient states chest tightness has improved since last night.  Patient states able to speak in full sentences when ambulating now which was not the case last night.  States shortness of breath improved.  No nausea or emesis.  No abdominal pain.  Patient is alert and oriented x3.   Objective: Vitals:   01/12/18 0600 01/12/18 0800 01/12/18 0900 01/12/18 0936  BP: 103/82 115/69 112/85   Pulse: (!) 52 (!) 136 (!) 125 (!) 134  Resp: (!) 26 20 (!) 32   Temp:  98.9 F (37.2 C)    TempSrc:  Oral    SpO2: 93% 95% 96%   Weight:      Height:        Intake/Output Summary (Last 24 hours) at 01/12/2018 0958 Last data filed at 01/12/2018 0827 Gross per 24 hour  Intake 1123 ml  Output 3350 ml  Net -2227 ml   Filed Weights   01/10/18 1307 01/10/18 2037 01/12/18 0500  Weight: 113.4 kg (250 lb) 112.2 kg (247 lb 5.7 oz) 112.7 kg (248 lb 7.3 oz)    Examination:  General exam: Appears calm and comfortable Respiratory system: Decreased bibasilar crackles.  No wheezing.  No rhonchi.  Normal respiratory effort.  Cardiovascular system: Irregularly irregular.  No JVD.  No murmurs, rubs or gallops.  Trace lower extremity edema.  Gastrointestinal system: Abdomen is soft, nontender, nondistended, positive bowel sounds.  No rebound.  No guarding.  Central nervous system: Alert and oriented. No focal neurological deficits. Extremities: Symmetric 5 x 5 power. Skin: No rashes, lesions or ulcers Psychiatry: Judgement and  insight appear normal. Mood & affect appropriate.     Data Reviewed: I have personally reviewed following labs and imaging studies  CBC: Recent Labs  Lab 01/10/18 1308 01/11/18 0216 01/12/18 0319  WBC 7.8 8.6 8.0  NEUTROABS 4.5  --   --   HGB 14.2 13.5 13.0  HCT 43.6 40.8 39.3  MCV 84.3 84.8 84.0  PLT 228 225 200   Basic Metabolic Panel: Recent Labs  Lab 01/10/18 1308 01/11/18 0216 01/12/18 0319  NA 143 142 138  K 3.8 3.7 3.7  CL 113* 111 110  CO2 19* 20* 18*  GLUCOSE 92 110* 111*  BUN 16 12 12   CREATININE 0.89 0.84 0.82  CALCIUM 9.0 8.8* 8.8*  MG 1.8 1.7 1.8  PHOS 3.5 3.6  --    GFR: Estimated Creatinine Clearance: 166.3 mL/min (by C-G formula based on SCr of 0.82 mg/dL). Liver Function Tests: Recent Labs  Lab 01/10/18 1308 01/11/18 0216  AST 24 21  ALT 28 27  ALKPHOS 93 82  BILITOT 1.8* 1.9*  PROT 6.5 5.9*  ALBUMIN 3.6 3.2*   No results for input(s): LIPASE, AMYLASE in the last 168 hours. No results for input(s): AMMONIA in the last 168 hours. Coagulation Profile: Recent Labs  Lab 01/10/18 1308  INR 1.29   Cardiac Enzymes: Recent Labs  Lab 01/10/18 2049 01/11/18 0216 01/11/18 0806  TROPONINI 0.04* 0.03* 0.03*   BNP (last 3 results) No results for input(s): PROBNP in the last 8760 hours. HbA1C: Recent Labs    01/11/18 0216  HGBA1C 5.4   CBG: No results for input(s): GLUCAP in the last 168 hours. Lipid Profile: Recent Labs    01/12/18 0319  CHOL 108  HDL 32*  LDLCALC 67  TRIG 47  CHOLHDL 3.4   Thyroid Function Tests: Recent Labs    01/10/18 1830 01/10/18 1831 01/11/18 0216  TSH  --   --  <0.010*  FREET4  --  4.13*  --   T3FREE 8.8*  --   --    Anemia Panel: No results for input(s): VITAMINB12, FOLATE, FERRITIN, TIBC, IRON, RETICCTPCT in the last 72 hours. Sepsis Labs: Recent Labs  Lab 01/10/18 2049  LATICACIDVEN 1.2    Recent Results (from the past 240 hour(s))  MRSA PCR Screening     Status: None   Collection  Time: 01/10/18  8:31 PM  Result Value Ref Range Status   MRSA by PCR NEGATIVE NEGATIVE Final    Comment:        The GeneXpert MRSA Assay (FDA approved for NASAL specimens only), is one component of a comprehensive MRSA colonization surveillance program. It is not intended to diagnose MRSA infection nor to guide or monitor treatment for MRSA infections. Performed at [facility], [address], [place], [place] [address]          Radiology Studies: Dg Chest Port 1 View  Result Date: 01/11/2018 CLINICAL DATA:  Shortness of breath and cough EXAM: PORTABLE CHEST 1 VIEW COMPARISON:  Yesterday FINDINGS: Cardiomegaly with probable Kerley lines and possible trace effusions. No air bronchogram or pneumothorax. Stable aortic and hilar contours. IMPRESSION: CHF pattern. Electronically Signed   By: Marnee Spring M.D.   On: 01/11/2018 12:41   Dg Chest Port 1 View  Result Date: 01/10/2018 CLINICAL DATA:  Fluttering sensation in the chest for the past week. EXAM: PORTABLE CHEST 1 VIEW COMPARISON:  None. FINDINGS: Mild basilar atelectasis is seen. Heart size is upper normal. No pneumothorax or pleural effusion. No acute bony abnormality. IMPRESSION: No acute disease. Electronically Signed   By: Drusilla Kanner M.D.   On: 01/10/2018 14:24        Scheduled Meds: . carvedilol  6.25 mg Oral BID WC  . fluticasone  2 spray Each Nare Daily  . folic acid  1 mg Oral Daily  . furosemide  20 mg Intravenous Q12H  . loratadine  10 mg Oral Daily  . multivitamin with minerals  1 tablet Oral Daily  . thiamine  100 mg Oral Daily   Or  . thiamine  100 mg Intravenous Daily   Continuous Infusions: . diltiazem (CARDIZEM) infusion 15 mg/hr (01/12/18 0522)  . heparin 1,800 Units/hr (01/12/18 6045)  . magnesium sulfate 1 - 4 g bolus IVPB 4 g (01/12/18 0827)     LOS: 1 day    Time spent: 45 mins    Ramiro Harvest, MD [facility] Pager [phone]  If 7PM-7AM,  please contact night-coverage [email or web address] Password [email or web address] 01/12/2018, 9:58 AM

## 2018-01-12 NOTE — Evaluation (Signed)
Physical Therapy Evaluation Patient Details Name: Logan Meadows MRN: [id number] DOB: August 05, 1981 Today's Date: 01/12/2018   History of Present Illness  37 year old male history of allergies, history of cocaine and marijuana use presented to the ED with palpitations, diaphoresis, diarrhea, weight loss x3 weeks and noted at the urgent care center to be tachycardic and in A. fib.  Patient sent to the ED noted to be in A. fib/a flutter placed on a Cardizem drip and noted to be significantly hyperthyroidism..  Clinical Impression  Pt is independent with mobility, no further PT indicated. Pt ambulated 400' without an assistive device, no loss of balance, HR 134 max, no dyspnea. PT signing off.     Follow Up Recommendations No PT follow up    Equipment Recommendations  None recommended by PT    Recommendations for Other Services       Precautions / Restrictions Precautions Precautions: None Restrictions Weight Bearing Restrictions: No      Mobility  Bed Mobility Overal bed mobility: Independent                Transfers Overall transfer level: Independent                  Ambulation/Gait Ambulation/Gait assistance: Independent Ambulation Distance (Feet): 400 Feet Assistive device: None Gait Pattern/deviations: Wide base of support;WFL(Within Functional Limits) Gait velocity: WFL   General Gait Details: steady, mildly wide BOS, HR 134 max with walking, no dyspnea, no loss of balance  Stairs            Wheelchair Mobility    Modified Rankin (Stroke Patients Only)       Balance Overall balance assessment: Independent                                           Pertinent Vitals/Pain Pain Assessment: No/denies pain    Home Living Family/patient expects to be discharged to:: Private residence Living Arrangements: Parent Available Help at Discharge: Family;Available PRN/intermittently   Home Access: Level entry     Home  Layout: One level Home Equipment: None      Prior Function Level of Independence: Independent         Comments: works as a Optometrist at [employer]        Extremity/Trunk Assessment   Upper Extremity Assessment Upper Extremity Assessment: Overall WFL for tasks assessed    Lower Extremity Assessment Lower Extremity Assessment: Overall WFL for tasks assessed    Cervical / Trunk Assessment Cervical / Trunk Assessment: Normal  Communication   Communication: No difficulties  Cognition Arousal/Alertness: Awake/alert Behavior During Therapy: WFL for tasks assessed/performed Overall Cognitive Status: Within Functional Limits for tasks assessed                                        General Comments      Exercises     Assessment/Plan    PT Assessment Patent does not need any further PT services  PT Problem List         PT Treatment Interventions      PT Goals (Current goals can be found in the Care Plan section)  Acute Rehab PT Goals Patient Stated Goal: return to work PT Goal Formulation: All assessment and education  complete, DC therapy    Frequency     Barriers to discharge        Co-evaluation               AM-PAC PT "6 Clicks" Daily Activity  Outcome Measure Difficulty turning over in bed (including adjusting bedclothes, sheets and blankets)?: None Difficulty moving from lying on back to sitting on the side of the bed? : None Difficulty sitting down on and standing up from a chair with arms (e.g., wheelchair, bedside commode, etc,.)?: None Help needed moving to and from a bed to chair (including a wheelchair)?: None Help needed walking in hospital room?: None Help needed climbing 3-5 steps with a railing? : None 6 Click Score: 24    End of Session   Activity Tolerance: Patient tolerated treatment well Patient left: in chair;with call bell/phone within reach Nurse Communication:  Mobility status      Time: 5361-4431 PT Time Calculation (min) (ACUTE ONLY): 11 min   Charges:   PT Evaluation $PT Eval Low Complexity: 1 Low     PT G Codes:          Tamala Ser 01/12/2018, 9:39 AM [phone]

## 2018-01-12 NOTE — Progress Notes (Signed)
ANTICOAGULATION CONSULT NOTE Pharmacy Consult for IV heparin Indication: atrial fibrillation  No Known Allergies  Patient Measurements: Height: 6\' 2"  (188 cm) Weight: 247 lb 5.7 oz (112.2 kg) IBW/kg (Calculated) : 82.2 Heparin Dosing Weight: 105.8 kg  Vital Signs: Temp: 98.6 F (37 C) (05/19 0000) Temp Source: Oral (05/19 0000) BP: 112/75 (05/19 0000) Pulse Rate: 133 (05/19 0000)  Labs: Recent Labs    01/10/18 1308 01/10/18 2049 01/11/18 0216 01/11/18 0806 01/12/18 0319  HGB 14.2  --  13.5  --  13.0  HCT 43.6  --  40.8  --  39.3  PLT 228  --  225  --  200  LABPROT 16.0*  --   --   --   --   INR 1.29  --   --   --   --   HEPARINUNFRC  --  0.39 0.31  --  0.26*  CREATININE 0.89  --  0.84  --  0.82  TROPONINI  --  0.04* 0.03* 0.03*  --     Estimated Creatinine Clearance: 165.9 mL/min (by C-G formula based on SCr of 0.82 mg/dL).  Assessment: 37 yo male to ER on heparin drip per Rx for A flutter . Baseline labs ordered.  01/12/2018  0319 HL= 0.26 below goal, no bleeding or infusion issues per RN. CBC stable, no bleeding reported.   Goal of Therapy:  Heparin level 0.3-0.7 units/ml Monitor platelets by anticoagulation protocol: Yes   Plan:   increase IV heparin infusion rate to 1800 units/hr Recheck HL in 6 hours  Daily heparin level and CBC  Plan to transition to apixaban later per cards note   Lorenza Evangelist 01/12/2018 6:04 AM

## 2018-01-12 NOTE — Progress Notes (Addendum)
Progress Note  Patient Name: Logan Meadows Date of Encounter: 01/12/2018  Primary Cardiologist: Dr Eden Emms  Subjective   Dyspnea improved; no chest pain  Inpatient Medications    Scheduled Meds: . carvedilol  3.125 mg Oral BID WC  . fluticasone  2 spray Each Nare Daily  . folic acid  1 mg Oral Daily  . furosemide  20 mg Intravenous Q12H  . loratadine  10 mg Oral Daily  . multivitamin with minerals  1 tablet Oral Daily  . thiamine  100 mg Oral Daily   Or  . thiamine  100 mg Intravenous Daily   Continuous Infusions: . diltiazem (CARDIZEM) infusion 15 mg/hr (01/12/18 0522)  . heparin 1,800 Units/hr (01/12/18 1499)  . magnesium sulfate 1 - 4 g bolus IVPB     PRN Meds: acetaminophen **OR** acetaminophen, levalbuterol **AND** ipratropium, LORazepam **OR** LORazepam, ondansetron **OR** ondansetron (ZOFRAN) IV   Vital Signs    Vitals:   01/12/18 0000 01/12/18 0300 01/12/18 0500 01/12/18 0600  BP: 112/75 92/61 (!) 139/119 103/82  Pulse: (!) 133 (!) 133 70 (!) 52  Resp: (!) 28 (!) 26 (!) 25 (!) 26  Temp: 98.6 F (37 C)  97.6 F (36.4 C)   TempSrc: Oral  Oral   SpO2: 92% 92% 93% 93%  Weight:   248 lb 7.3 oz (112.7 kg)   Height:        Intake/Output Summary (Last 24 hours) at 01/12/2018 0800 Last data filed at 01/12/2018 0737 Gross per 24 hour  Intake 1177 ml  Output 3850 ml  Net -2673 ml   Filed Weights   01/10/18 1307 01/10/18 2037 01/12/18 0500  Weight: 250 lb (113.4 kg) 247 lb 5.7 oz (112.2 kg) 248 lb 7.3 oz (112.7 kg)    Telemetry    Atrial flutter with elevated rate- Personally Reviewed  Physical Exam   GEN: WD/WN No acute distress.   Neck: No JVD, supple Cardiac: irregular and tachycardic, no murmur Respiratory: Mildly diminished BS bases GI: Soft, nontender, non-distended, no masses MS: Trace edema Neuro:  Grossly intact   Labs    Chemistry Recent Labs  Lab 01/10/18 1308 01/11/18 0216 01/12/18 0319  NA 143 142 138  K 3.8 3.7 3.7    CL 113* 111 110  CO2 19* 20* 18*  GLUCOSE 92 110* 111*  BUN 16 12 12   CREATININE 0.89 0.84 0.82  CALCIUM 9.0 8.8* 8.8*  PROT 6.5 5.9*  --   ALBUMIN 3.6 3.2*  --   AST 24 21  --   ALT 28 27  --   ALKPHOS 93 82  --   BILITOT 1.8* 1.9*  --   GFRNONAA >60 >60 >60  GFRAA >60 >60 >60  ANIONGAP 11 11 10      Hematology Recent Labs  Lab 01/10/18 1308 01/11/18 0216 01/12/18 0319  WBC 7.8 8.6 8.0  RBC 5.17 4.81 4.68  HGB 14.2 13.5 13.0  HCT 43.6 40.8 39.3  MCV 84.3 84.8 84.0  MCH 27.5 28.1 27.8  MCHC 32.6 33.1 33.1  RDW 14.5 14.4 14.5  PLT 228 225 200    Cardiac Enzymes Recent Labs  Lab 01/10/18 2049 01/11/18 0216 01/11/18 0806  TROPONINI 0.04* 0.03* 0.03*    Recent Labs  Lab 01/10/18 1312  TROPIPOC 0.02     Radiology    Dg Chest Port 1 View  Result Date: 01/11/2018 CLINICAL DATA:  Shortness of breath and cough EXAM: PORTABLE CHEST 1 VIEW COMPARISON:  Yesterday FINDINGS: Cardiomegaly  with probable Kerley lines and possible trace effusions. No air bronchogram or pneumothorax. Stable aortic and hilar contours. IMPRESSION: CHF pattern. Electronically Signed   By: Marnee Spring M.D.   On: 01/11/2018 12:41   Dg Chest Port 1 View  Result Date: 01/10/2018 CLINICAL DATA:  Fluttering sensation in the chest for the past week. EXAM: PORTABLE CHEST 1 VIEW COMPARISON:  None. FINDINGS: Mild basilar atelectasis is seen. Heart size is upper normal. No pneumothorax or pleural effusion. No acute bony abnormality. IMPRESSION: No acute disease. Electronically Signed   By: Drusilla Kanner M.D.   On: 01/10/2018 14:24    Patient Profile     37 y.o. male admitted with atrial flutter with rapid ventricular response.  Also noted to have hyperthyroidism.  Echocardiogram shows severely reduced LV function with ejection fraction 25 to 30%, mild mitral regurgitation and a small pericardial effusion.  Assessment & Plan    1 atrial flutter-patient remains in atrial flutter today with  elevated rate.  He was placed on Cardizem for rate control as he has a history of cocaine abuse most recently 1 week ago.  However given hyperthyroidism I started low-dose beta-blockade yesterday.  I will increase Coreg to 6.25 mg twice daily today.  Over time we will try to increase beta-blocker and decrease Cardizem.  His LV function is severely reduced which is most likely tachycardia mediated.  Continue heparin for now.  Transition to apixaban later.  Would plan TEE guided cardioversion but would need to correct hyperthyroidism first. CHADSvasc 1 for reduced LV function.   2 cardiomyopathy-patient's LV function is severely reduced.  This is likely tachycardia mediated.  Increase carvedilol to 6.25 mg twice daily.  Advance as tolerated and wean Cardizem.  Will not add ARB or ACE inhibitor at this point because blood pressure is borderline but will consider later.  Hopefully LV function will improve once heart rate is controlled.    3 hyperthyroidism-this is primary service.  Likely the etiology of his atrial arrhythmia.  Further treatment per question if he needs steroids.  4 cocaine abuse-patient counseled on discontinuing.  5 acute systolic congestive heart failure-continue present dose of Lasix and follow renal function.  Symptoms are improving.  For questions or updates, please contact [facility] Please consult [email or web address] for contact info under Cardiology/STEMI.      Signed, Olga Millers, MD  01/12/2018, 8:00 AM

## 2018-01-12 NOTE — Progress Notes (Addendum)
Got patient up and out of bed. Shortness of breath with ambulation. Ambulated about 200 feet independently. HR remained 130's during ambulation. 02 sat remanied stable. Tolerated well. RN will continue to monitor.

## 2018-01-13 DIAGNOSIS — I4892 Unspecified atrial flutter: Secondary | ICD-10-CM

## 2018-01-13 DIAGNOSIS — I5021 Acute systolic (congestive) heart failure: Secondary | ICD-10-CM

## 2018-01-13 LAB — BASIC METABOLIC PANEL
ANION GAP: 10 (ref 5–15)
BUN: 12 mg/dL (ref 6–20)
CALCIUM: 9.2 mg/dL (ref 8.9–10.3)
CO2: 21 mmol/L — AB (ref 22–32)
Chloride: 110 mmol/L (ref 101–111)
Creatinine, Ser: 0.86 mg/dL (ref 0.61–1.24)
GFR calc Af Amer: >60 mL/min (ref >60)
GFR calc non Af Amer: >60 mL/min (ref >60)
Glucose, Bld: 114 mg/dL — ABNORMAL HIGH (ref 65–99)
Potassium: 3.7 mmol/L (ref 3.5–5.1)
Sodium: 141 mmol/L (ref 135–145)

## 2018-01-13 LAB — CBC
HEMATOCRIT: 42.8 % (ref 39.0–52.0)
Hemoglobin: 14 g/dL (ref 13.0–17.0)
MCH: 27.7 pg (ref 26.0–34.0)
MCHC: 32.7 g/dL (ref 30.0–36.0)
MCV: 84.6 fL (ref 78.0–100.0)
PLATELETS: 222 10*3/uL (ref 150–400)
RBC: 5.06 MIL/uL (ref 4.22–5.81)
RDW: 14.6 % (ref 11.5–15.5)
WBC: 7.9 10*3/uL (ref 4.0–10.5)

## 2018-01-13 LAB — HIV ANTIBODY (ROUTINE TESTING W REFLEX): HIV SCREEN 4TH GENERATION: NONREACTIVE

## 2018-01-13 LAB — BRAIN NATRIURETIC PEPTIDE: B Natriuretic Peptide: 170.8 pg/mL — ABNORMAL HIGH (ref 0.0–100.0)

## 2018-01-13 LAB — HEPARIN LEVEL (UNFRACTIONATED)
Heparin Unfractionated: 0.56 IU/mL (ref 0.30–0.70)
Heparin Unfractionated: 0.8 IU/mL — ABNORMAL HIGH (ref 0.30–0.70)

## 2018-01-13 LAB — MAGNESIUM: Magnesium: 1.8 mg/dL (ref 1.7–2.4)

## 2018-01-13 LAB — CORTISOL: Cortisol, Plasma: 18.2 ug/dL

## 2018-01-13 MED ORDER — APIXABAN 5 MG PO TABS
5.0000 mg | ORAL_TABLET | Freq: Two times a day (BID) | ORAL | Status: DC
  Administered 2018-01-13 – 2018-01-19 (×13): 5 mg via ORAL
  Filled 2018-01-13 (×13): qty 1

## 2018-01-13 MED ORDER — HEPARIN (PORCINE) IN NACL 100-0.45 UNIT/ML-% IJ SOLN: 1800.0000 [IU]/h | INTRAMUSCULAR | Status: DC

## 2018-01-13 MED ORDER — MAGNESIUM SULFATE 4 GM/100ML IV SOLN
4.0000 g | Freq: Once | INTRAVENOUS | Status: AC
  Administered 2018-01-13: 4 g via INTRAVENOUS
  Filled 2018-01-13: qty 100

## 2018-01-13 MED ORDER — FUROSEMIDE 40 MG PO TABS
40.0000 mg | ORAL_TABLET | Freq: Every day | ORAL | Status: DC
  Administered 2018-01-14 – 2018-01-17 (×4): 40 mg via ORAL
  Filled 2018-01-13 (×4): qty 1

## 2018-01-13 MED ORDER — CARVEDILOL 3.125 MG PO TABS
3.1250 mg | ORAL_TABLET | Freq: Once | ORAL | Status: AC
  Administered 2018-01-13: 3.125 mg via ORAL
  Filled 2018-01-13: qty 1

## 2018-01-13 MED ORDER — CARVEDILOL 12.5 MG PO TABS
12.5000 mg | ORAL_TABLET | Freq: Two times a day (BID) | ORAL | Status: DC
  Administered 2018-01-13 – 2018-01-18 (×10): 12.5 mg via ORAL
  Filled 2018-01-13 (×10): qty 1

## 2018-01-13 MED ORDER — CARVEDILOL 6.25 MG PO TABS: 9.3750 mg | ORAL_TABLET | Freq: Two times a day (BID) | ORAL | Status: DC

## 2018-01-13 NOTE — Progress Notes (Addendum)
Progress Note  Patient Name: FENG DEVICH Date of Encounter: 01/13/2018  Primary Cardiologist: Charlton Haws, MD   Subjective   No chest pain, no SOB, breathing is much better than Friday.  HR 133 in flutter  Inpatient Medications    Scheduled Meds: . carvedilol  6.25 mg Oral BID WC  . fluticasone  2 spray Each Nare Daily  . folic acid  1 mg Oral Daily  . furosemide  20 mg Intravenous Q12H  . loratadine  10 mg Oral Daily  . multivitamin with minerals  1 tablet Oral Daily  . thiamine  100 mg Oral Daily   Or  . thiamine  100 mg Intravenous Daily   Continuous Infusions: . diltiazem (CARDIZEM) infusion Stopped (01/12/18 2253)  . heparin 2,000 Units/hr (01/12/18 2253)   PRN Meds: acetaminophen **OR** acetaminophen, levalbuterol **AND** ipratropium, LORazepam **OR** LORazepam, ondansetron **OR** ondansetron (ZOFRAN) IV   Vital Signs    Vitals:   01/13/18 0200 01/13/18 0300 01/13/18 0344 01/13/18 0600  BP: 95/62 (!) 87/52    Pulse: (!) 132 (!) 131    Resp: (!) 24 (!) 23    Temp:   98.3 F (36.8 C)   TempSrc:   Oral   SpO2: 95% 93%    Weight:    244 lb 11.4 oz (111 kg)  Height:        Intake/Output Summary (Last 24 hours) at 01/13/2018 0746 Last data filed at 01/13/2018 0700 Gross per 24 hour  Intake 1338.33 ml  Output 1900 ml  Net -561.67 ml   Filed Weights   01/10/18 2037 01/12/18 0500 01/13/18 0600  Weight: 247 lb 5.7 oz (112.2 kg) 248 lb 7.3 oz (112.7 kg) 244 lb 11.4 oz (111 kg)    Telemetry    A flutter rate 133, pt sitting in bed - Personally Reviewed  ECG    No new - Personally Reviewed  Physical Exam   GEN: No acute distress.   Neck: No JVD sitting up right Cardiac: RRR but rapid, no murmurs, rubs, or gallops.  Respiratory: Clear to auscultation bilaterally. GI: Soft, nontender, non-distended  MS: No edema; No deformity. Neuro:  Nonfocal  Psych: Normal affect   Labs    Chemistry Recent Labs  Lab 01/10/18 1308 01/11/18 0216  01/12/18 0319 01/13/18 0645  NA 143 142 138 141  K 3.8 3.7 3.7 3.7  CL 113* 111 110 110  CO2 19* 20* 18* 21*  GLUCOSE 92 110* 111* 114*  BUN 16 12 12 12   CREATININE 0.89 0.84 0.82 0.86  CALCIUM 9.0 8.8* 8.8* 9.2  PROT 6.5 5.9*  --   --   ALBUMIN 3.6 3.2*  --   --   AST 24 21  --   --   ALT 28 27  --   --   ALKPHOS 93 82  --   --   BILITOT 1.8* 1.9*  --   --   GFRNONAA >60 >60 >60 >60  GFRAA >60 >60 >60 >60  ANIONGAP 11 11 10 10      Hematology Recent Labs  Lab 01/11/18 0216 01/12/18 0319 01/13/18 0645  WBC 8.6 8.0 7.9  RBC 4.81 4.68 5.06  HGB 13.5 13.0 14.0  HCT 40.8 39.3 42.8  MCV 84.8 84.0 84.6  MCH 28.1 27.8 27.7  MCHC 33.1 33.1 32.7  RDW 14.4 14.5 14.6  PLT 225 200 222    Cardiac Enzymes Recent Labs  Lab 01/10/18 2049 01/11/18 0216 01/11/18 0806  TROPONINI 0.04*  0.03* 0.03*    Recent Labs  Lab 01/10/18 1312  TROPIPOC 0.02     BNP Recent Labs  Lab 01/11/18 1557  BNP 209.0*     DDimer No results for input(s): DDIMER in the last 168 hours.   Radiology    Dg Chest Port 1 View  Result Date: 01/11/2018 CLINICAL DATA:  Shortness of breath and cough EXAM: PORTABLE CHEST 1 VIEW COMPARISON:  Yesterday FINDINGS: Cardiomegaly with probable Kerley lines and possible trace effusions. No air bronchogram or pneumothorax. Stable aortic and hilar contours. IMPRESSION: CHF pattern. Electronically Signed   By: Marnee Spring M.D.   On: 01/11/2018 12:41    Cardiac Studies   Echo 01/11/18 Study Conclusions  - Left ventricle: The cavity size was moderately dilated. Wall   thickness was normal. Systolic function was severely reduced. The   estimated ejection fraction was in the range of 25% to 30%.   Diffuse hypokinesis. The study is not technically sufficient to   allow evaluation of LV diastolic function. Doppler parameters are   consistent with high ventricular filling pressure. - Mitral valve: There was mild regurgitation. - Left atrium: The atrium was  moderately to severely dilated. - Right ventricle: The cavity size was mildly dilated. - Right atrium: The atrium was mildly dilated. - Pericardium, extracardiac: A small pericardial effusion was   identified. There was a left pleural effusion.  Impressions:  - Severe global reduction in LV systolic function; 4 chamber   enlargement; mild MR and TR; small pericardial effusion.   Patient Profile     37 y.o. male with no cardiac hx, no hx DM, HTN, HLD. No regular checkups. He has hx of seasonal allergies (wears a mask to mow)  treated w/ OTC rx, tob use, THC and cocaine use now admitted with hyperthyroid and a flutter with RVR.  Echo with EF 25-30%.  Assessment & Plan    A flutter--on dilt.  We were attempting to avoid BB due to hx cocaine use and most recent use 1 weeks ago, but with continued elevated rate, coreg was started.  Now 6.25 BID, pt is on IV heparin and will change to NOAC prior to discharge.   Plan for correct hyperthyroidism then DCCV.  CHADSvasc 1 for reduced LV function --add IV dig?  Dr. Excell Seltzer to see.  Cardiomyopathy, likely tachycardia mediated.  Plan increase BB and decrease dilt .  BP borderline will hold adding ACE or ARB at this time.  EF 25-30%. Mild MR.   Acute systolic HF, on lasix 20 mg every 12 hours.  Negative 1512 since admit and wt down 4 lbs.  Cr stable at 0.86.  BNP was 209.   Hyperthyroidism per primary service.  Most likely etiology of a flutter. TSH <0.010  For questions or updates, please contact [facility] Please consult [email or web address] for contact info under Cardiology/STEMI.   Signed, Nada Boozer, NP  01/13/2018, 7:46 AM    Patient seen, examined. Available data reviewed. Agree with findings, assessment, and plan as outlined by Nada Boozer, NP. On my exam: Vitals:   01/13/18 0344 01/13/18 0800  BP:  125/87  Pulse:  (!) 133  Resp:  (!) 21  Temp: 98.3 F (36.8 C) 98.6 F (37 C)  SpO2:  99%   Pt is alert and oriented, NAD HEENT:  normal Neck: JVP - normal Lungs: CTA bilaterally CV: tachy and regular without murmur or gallop Abd: soft, NT, Positive BS, no hepatomegaly Ext: trace pretibial edema bilaterally, distal pulses  intact and equal Skin: warm/dry no rash  Tele reviewed and shows AFlutter HR 130's. IV dilt stopped last night for hypotension. Plan: Increase coreg to 12.5 mg BID Transition heparin to apixaban 5 mg BID Plans noted for thyroid reuptake scan tomorrow and Wednesday, then initiate treatment with methimazole.  AFlutter will be challenging to control until underlying hyperthyroidism is corrected. Will follow with you and continue to increase beta blockade as tolerated. Eventually will need TEE/cardioversion depending on clinical course. Appears to be better compensated and slowly improving. Suspect he will need DCCV prior to discharge if HR is not improved with more aggressive AV nodal blockade and increase in beta blocker.  Would change furosemide to 40 mg oral daily as CHF appears better compensated.   Will follow with you. thx  Tonny Bollman, M.D. 01/13/2018 9:31 AM

## 2018-01-13 NOTE — Progress Notes (Signed)
PROGRESS NOTE    VIDUR KNUST  ZOX:[id number] DOB: 1980/12/16 DOA: 01/10/2018 PCP: Patient, No Pcp Per    Brief Narrative:  37 year old male history of allergies, history of cocaine and marijuana use presented to the ED with palpitations, diaphoresis, diarrhea, weight loss x3 weeks and noted at the urgent care center to be tachycardic and in A. fib.  Patient sent to the ED noted to be in A. fib/a flutter placed on a Cardizem drip and noted to be significantly hyperthyroidism.. Patient also noted to be in acute systolic heart failure.  Patient placed on IV diuretics and beta-blocker added to patient's regimen.   Assessment & Plan:   Principal Problem:   Atrial flutter with rapid ventricular response (HCC) Active Problems:   Atrial fibrillation with rapid ventricular response (HCC)   Hyperthyroidism   Acute systolic (congestive) heart failure (HCC)   Hx of cocaine abuse   Atrial fibrillation with RVR (HCC)   Cardiomyopathy (HCC)  #1  Atrial flutter  CHA2DS2VASC = 1( DEPRESSED  EF) Likely secondary to primary hyperthyroidism.  Patient still tachycardic on Cardizem drip with heart rate in the 130s.  Cardizem drip has been discontinued per cardiology.  Chest tightness and shortness of breath improved with diuresis.  Troponins minimally elevated at 0.03.  2D echo with a EF of 25 to 30% with diffuse hypokinesis, severe global reduction in left ventricular systolic function, four-chamber enlargement, mild MR and TR, small pericardial effusion.  LDL of 67.  Hemoglobin A1c of 5.4.  Coreg dose has been increased to 12.5 mg twice daily per cardiology.  Importance of cessation of cocaine abuse stressed to patient who has agreed to stop using cocaine. Patient is being transitioned to Eliquis from heparin per cardiology. Keep magnesium greater than 2.  Keep potassium greater than 4.  Per cardiology.  #2.  Hyperthyroidism ?? etiology.  Patient is afebrile, alert and oriented with no confusion, no  nausea or vomiting, no abdominal pain.  Patient NOT IN thyroid storm at this time.  Concern for possible Graves' disease versus thyroiditis.  TSH was less than 0.010.  Free T4 was 4.13.  Free T3 was 8.8.  Thyroid reuptake scan has been ordered and currently pending and likely will not be done until 01/14/2018- 01/15/2018 per RN.Marland Kitchen  Thyrotropin receptor antibodies pending.  Thyroid peroxidase antibody at 12, within normal limits. Cortisol level is 18.2. Will need to wait until thyroid reuptake scan has been completed prior to starting patient on methimazole 10 mg 3 times daily during this hospitalization and subsequently transitioned to methimazole 30 mg daily on discharge.  Currently no need for steroids at this time as patient NOT IN a thyroid storm.  Will need outpatient follow-up with endocrinology.  Case discussed with Dr. Sharl Ma of endocrinology.  #3 cardiomyopathy  2D echo with a EF of 25 to 30% with diffuse hypokinesis, severe global reduction in left ventricular systolic function, four-chamber enlargement, mild MR and TR, small pericardial effusion.  Fasting lipid panel with a LDL of 67.  Hemoglobin A1c 5.4.  Continue beta-blocker. Cardiology following.  #4.  Cocaine abuse Cocaine cessation stressed to patient.  Patient stated he would rather live.  5.  Acute systolic heart failure Likely secondary to atrial flutter.  Patient on 5/17/518 was complaining of some chest tightness on ambulation and choppy sentences on ambulation with some shortness of breath on minimal exertion.  BNP obtained was 209.  Chest x-ray obtained was consistent with volume overload.  2D echo with a EF of  25 to 30% with diffuse hypokinesis, severe global reduction in left ventricular systolic function, four-chamber enlargement, mild MR and TR, small pericardial effusion.  LDL is 67.  Patient started on Lasix 20 mg IV every 12 hours.  Patient with a urine output of 2.8 L overnight.  Current weight is 244 pounds from 248 pounds from  250 pounds on admission.  Patient seems compensated at this time.  Will transition from IV Lasix to oral Lasix 40 mg daily.  Cardizem has been discontinued.  Continue Coreg.  Patient being transitioned from heparin to Eliquis per cardiology. Cardiology following.       DVT prophylaxis: Heparin/Eliquis Code Status: Full Family Communication: Updated patient.  No family at bedside. Disposition Plan: Remain in the stepdown unit.  Likely home once clinically improved and medically stable and after thyroid reuptake scan has been completed and patient started on methimazole, likely Thursday, 01/16/2018.   Consultants:   Cardiology: Dr. Eden Emms 01/10/2018  Procedures:   2D echo 01/11/2018  Chest x-ray 01/10/2018  Antimicrobials:   None   Subjective: Patient sitting up in bed.  States shortness of breath and chest tightness improved.  Denies any chest pain.  No nausea or vomiting.  No abdominal pain.  Patient is alert and oriented x3.    Objective: Vitals:   01/13/18 0300 01/13/18 0344 01/13/18 0600 01/13/18 0800  BP: (!) 87/52   125/87  Pulse: (!) 131   (!) 133  Resp: (!) 23   (!) 21  Temp:  98.3 F (36.8 C)  98.6 F (37 C)  TempSrc:  Oral  Oral  SpO2: 93%   99%  Weight:   111 kg (244 lb 11.4 oz)   Height:        Intake/Output Summary (Last 24 hours) at 01/13/2018 0936 Last data filed at 01/13/2018 0900 Gross per 24 hour  Intake 1042.33 ml  Output 1880 ml  Net -837.67 ml   Filed Weights   01/10/18 2037 01/12/18 0500 01/13/18 0600  Weight: 112.2 kg (247 lb 5.7 oz) 112.7 kg (248 lb 7.3 oz) 111 kg (244 lb 11.4 oz)    Examination:  General exam: Appears calm and comfortable Respiratory system: Lungs clear to auscultation bilaterally.  No wheezes, no crackles, no rhonchi.  Normal respiratory effort. Cardiovascular system: Irregularly irregular.  Tachycardic no JVD.  No murmurs, rubs or gallops.  No lower extremity edema. Gastrointestinal system: Abdomen is nontender, soft,  nondistended, positive bowel sounds.  No rebound.  No guarding.  Central nervous system: Alert and oriented. No focal neurological deficits. Extremities: Symmetric 5 x 5 power. Skin: No rashes, lesions or ulcers Psychiatry: Judgement and insight appear normal. Mood & affect appropriate.     Data Reviewed: I have personally reviewed following labs and imaging studies  CBC: Recent Labs  Lab 01/10/18 1308 01/11/18 0216 01/12/18 0319 01/13/18 0645  WBC 7.8 8.6 8.0 7.9  NEUTROABS 4.5  --   --   --   HGB 14.2 13.5 13.0 14.0  HCT 43.6 40.8 39.3 42.8  MCV 84.3 84.8 84.0 84.6  PLT 228 225 200 222   Basic Metabolic Panel: Recent Labs  Lab 01/10/18 1308 01/11/18 0216 01/12/18 0319 01/13/18 0645  NA 143 142 138 141  K 3.8 3.7 3.7 3.7  CL 113* 111 110 110  CO2 19* 20* 18* 21*  GLUCOSE 92 110* 111* 114*  BUN 16 12 12 12   CREATININE 0.89 0.84 0.82 0.86  CALCIUM 9.0 8.8* 8.8* 9.2  MG 1.8 1.7  1.8 1.8  PHOS 3.5 3.6  --   --    GFR: Estimated Creatinine Clearance: 157.4 mL/min (by C-G formula based on SCr of 0.86 mg/dL). Liver Function Tests: Recent Labs  Lab 01/10/18 1308 01/11/18 0216  AST 24 21  ALT 28 27  ALKPHOS 93 82  BILITOT 1.8* 1.9*  PROT 6.5 5.9*  ALBUMIN 3.6 3.2*   No results for input(s): LIPASE, AMYLASE in the last 168 hours. No results for input(s): AMMONIA in the last 168 hours. Coagulation Profile: Recent Labs  Lab 01/10/18 1308  INR 1.29   Cardiac Enzymes: Recent Labs  Lab 01/10/18 2049 01/11/18 0216 01/11/18 0806  TROPONINI 0.04* 0.03* 0.03*   BNP (last 3 results) No results for input(s): PROBNP in the last 8760 hours. HbA1C: Recent Labs    01/11/18 0216 01/12/18 0319  HGBA1C 5.4 5.4   CBG: No results for input(s): GLUCAP in the last 168 hours. Lipid Profile: Recent Labs    01/12/18 0319  CHOL 108  HDL 32*  LDLCALC 67  TRIG 47  CHOLHDL 3.4   Thyroid Function Tests: Recent Labs    01/10/18 1830 01/10/18 1831 01/11/18 0216   TSH  --   --  <0.010*  FREET4  --  4.13*  --   T3FREE 8.8*  --   --    Anemia Panel: No results for input(s): VITAMINB12, FOLATE, FERRITIN, TIBC, IRON, RETICCTPCT in the last 72 hours. Sepsis Labs: Recent Labs  Lab 01/10/18 2049  LATICACIDVEN 1.2    Recent Results (from the past 240 hour(s))  MRSA PCR Screening     Status: None   Collection Time: 01/10/18  8:31 PM  Result Value Ref Range Status   MRSA by PCR NEGATIVE NEGATIVE Final    Comment:        The GeneXpert MRSA Assay (FDA approved for NASAL specimens only), is one component of a comprehensive MRSA colonization surveillance program. It is not intended to diagnose MRSA infection nor to guide or monitor treatment for MRSA infections. Performed at [facility], [address] [place], [place] [address]          Radiology Studies: Dg Chest Port 1 View  Result Date: 01/11/2018 CLINICAL DATA:  Shortness of breath and cough EXAM: PORTABLE CHEST 1 VIEW COMPARISON:  Yesterday FINDINGS: Cardiomegaly with probable Kerley lines and possible trace effusions. No air bronchogram or pneumothorax. Stable aortic and hilar contours. IMPRESSION: CHF pattern. Electronically Signed   By: Marnee Spring M.D.   On: 01/11/2018 12:41        Scheduled Meds: . carvedilol  6.25 mg Oral BID WC  . fluticasone  2 spray Each Nare Daily  . folic acid  1 mg Oral Daily  . furosemide  20 mg Intravenous Q12H  . loratadine  10 mg Oral Daily  . multivitamin with minerals  1 tablet Oral Daily  . thiamine  100 mg Oral Daily   Or  . thiamine  100 mg Intravenous Daily   Continuous Infusions: . diltiazem (CARDIZEM) infusion Stopped (01/12/18 2253)  . heparin 1,800 Units/hr (01/13/18 0913)     LOS: 2 days    Time spent: 40 mins    Ramiro Harvest, MD [facility] Pager [phone]  If 7PM-7AM, please contact night-coverage [email or web address] Password [facility] 01/13/2018, 9:36 AM

## 2018-01-13 NOTE — Progress Notes (Addendum)
ANTICOAGULATION CONSULT NOTE Pharmacy Consult for IV heparin >> Eliquis Indication: atrial fibrillation  No Known Allergies  Patient Measurements: Height: 6\' 2"  (188 cm) Weight: 244 lb 11.4 oz (111 kg) IBW/kg (Calculated) : 82.2 Heparin Dosing Weight: 105.8 kg  Vital Signs: Temp: 98.6 F (37 C) (05/20 0800) Temp Source: Oral (05/20 0800) BP: 125/87 (05/20 0800) Pulse Rate: 133 (05/20 0800)  Labs: Recent Labs    01/10/18 1308  01/10/18 2049 01/11/18 0216 01/11/18 0806 01/12/18 0319 01/12/18 1240 01/12/18 2329 01/13/18 0645  HGB 14.2  --   --  13.5  --  13.0  --   --  14.0  HCT 43.6  --   --  40.8  --  39.3  --   --  42.8  PLT 228  --   --  225  --  200  --   --  222  LABPROT 16.0*  --   --   --   --   --   --   --   --   INR 1.29  --   --   --   --   --   --   --   --   HEPARINUNFRC  --    < > 0.39 0.31  --  0.26* 0.22* 0.56 0.80*  CREATININE 0.89  --   --  0.84  --  0.82  --   --  0.86  TROPONINI  --   --  0.04* 0.03* 0.03*  --   --   --   --    < > = values in this interval not displayed.    Estimated Creatinine Clearance: 157.4 mL/min (by C-G formula based on SCr of 0.86 mg/dL).  Assessment: 24 yoM with PMH cocaine use admitted for hyperthyroidism, found to be in Aflutter with significantly reduced LVEF. Heparin per pharmacy.     Baseline INR wnl, aPTT not done  Prior anticoagulation: none  Significant events:  Today, 01/13/2018:  CBC: stable wnl  Most recent heparin level SUPRAtherapeutic on 2000 units/hr. Drawn from arm opposite heparin infusion. Prior level therapeutic but appears (at least per charting) that heparin was off for > 2 hr shortly before that level was drawn, which could make it falsely low  No bleeding or infusion issues per nursing  CrCl: > 90 ml/min  Goal of Therapy: Heparin level 0.3-0.7 units/ml Monitor platelets by anticoagulation protocol: Yes  Plan:  Reduce heparin IV infusion to 1800 units/hr (less than indicated rate  reduction but patient was previously low on this rate)  Check heparin level 6 hrs after rate change  Daily CBC, daily heparin level once stable  Monitor for signs of bleeding or thrombosis  Plan transition to Eliquis later today   Bernadene Person, PharmD, BCPS [phone] 01/13/2018, 9:39 AM

## 2018-01-13 NOTE — Progress Notes (Signed)
ANTICOAGULATION CONSULT NOTE Pharmacy Consult for IV heparin Indication: atrial fibrillation  No Known Allergies  Patient Measurements: Height: 6\' 2"  (188 cm) Weight: 248 lb 7.3 oz (112.7 kg) IBW/kg (Calculated) : 82.2 Heparin Dosing Weight: 105.8 kg  Vital Signs: Temp: 99.1 F (37.3 C) (05/20 0006) Temp Source: Oral (05/20 0006) BP: 87/54 (05/19 2200) Pulse Rate: 132 (05/19 2200)  Labs: Recent Labs    01/10/18 1308  01/10/18 2049 01/11/18 0216 01/11/18 0806 01/12/18 0319 01/12/18 1240 01/12/18 2329  HGB 14.2  --   --  13.5  --  13.0  --   --   HCT 43.6  --   --  40.8  --  39.3  --   --   PLT 228  --   --  225  --  200  --   --   LABPROT 16.0*  --   --   --   --   --   --   --   INR 1.29  --   --   --   --   --   --   --   HEPARINUNFRC  --    < > 0.39 0.31  --  0.26* 0.22* 0.56  CREATININE 0.89  --   --  0.84  --  0.82  --   --   TROPONINI  --   --  0.04* 0.03* 0.03*  --   --   --    < > = values in this interval not displayed.    Estimated Creatinine Clearance: 166.3 mL/min (by C-G formula based on SCr of 0.82 mg/dL).  Assessment: 37 yo male to ER on heparin drip per Rx for A flutter . Baseline labs ordered.  5/20 0319 HL= 0.26 below goal, no bleeding or infusion issues per RN. CBC stable, no bleeding reported 1240 HL = 0.22 remain below goal after heparin rate increase from 1600 to 1800 units/hr- per RN no interruptions in infusion 2329 HL=0.56 at goal, no infusion or bleeding issues per RN.   Goal of Therapy:  Heparin level 0.3-0.7 units/ml Monitor platelets by anticoagulation protocol: Yes   Plan:  Continue heparin drip at 2000 units/hr Recheck HL with am labs to confirm  Daily heparin level and CBC  Plan to transition to apixaban later per cards note   Lorenza Evangelist 01/13/2018 12:22 AM

## 2018-01-14 ENCOUNTER — Inpatient Hospital Stay (HOSPITAL_COMMUNITY): Payer: BLUE CROSS/BLUE SHIELD

## 2018-01-14 LAB — CBC
HCT: 39.1 % (ref 39.0–52.0)
HEMOGLOBIN: 12.5 g/dL — AB (ref 13.0–17.0)
MCH: 27.2 pg (ref 26.0–34.0)
MCHC: 32 g/dL (ref 30.0–36.0)
MCV: 85 fL (ref 78.0–100.0)
Platelets: 202 10*3/uL (ref 150–400)
RBC: 4.6 MIL/uL (ref 4.22–5.81)
RDW: 14.5 % (ref 11.5–15.5)
WBC: 5.8 10*3/uL (ref 4.0–10.5)

## 2018-01-14 LAB — BASIC METABOLIC PANEL
Anion gap: 9 (ref 5–15)
BUN: 14 mg/dL (ref 6–20)
CALCIUM: 9 mg/dL (ref 8.9–10.3)
CHLORIDE: 108 mmol/L (ref 101–111)
CO2: 22 mmol/L (ref 22–32)
CREATININE: 0.86 mg/dL (ref 0.61–1.24)
GFR calc non Af Amer: >60 mL/min (ref >60)
Glucose, Bld: 103 mg/dL — ABNORMAL HIGH (ref 65–99)
Potassium: 4.1 mmol/L (ref 3.5–5.1)
SODIUM: 139 mmol/L (ref 135–145)

## 2018-01-14 LAB — MAGNESIUM: MAGNESIUM: 1.9 mg/dL (ref 1.7–2.4)

## 2018-01-14 MED ORDER — SODIUM CHLORIDE 0.9% FLUSH: 3.0000 mL | INTRAVENOUS | Status: DC | PRN

## 2018-01-14 MED ORDER — SODIUM CHLORIDE 0.9 % IV SOLN: 250.0000 mL | INTRAVENOUS | Status: DC

## 2018-01-14 MED ORDER — MAGNESIUM SULFATE 4 GM/100ML IV SOLN
4.0000 g | Freq: Once | INTRAVENOUS | Status: AC
  Administered 2018-01-14: 4 g via INTRAVENOUS
  Filled 2018-01-14: qty 100

## 2018-01-14 MED ORDER — SODIUM CHLORIDE 0.9% FLUSH
3.0000 mL | Freq: Two times a day (BID) | INTRAVENOUS | Status: DC
  Administered 2018-01-14 – 2018-01-16 (×4): 3 mL via INTRAVENOUS

## 2018-01-14 MED ORDER — SODIUM IODIDE I-123 7.4 MBQ CAPS
200.0000 | ORAL_CAPSULE | Freq: Once | ORAL | Status: AC
  Administered 2018-01-14: 293 via ORAL

## 2018-01-14 NOTE — Progress Notes (Addendum)
Progress Note  Patient Name: Logan Meadows Date of Encounter: 01/14/2018  Primary Cardiologist: Logan Haws, MD   Subjective   Can feel his HR when up to 170s with exertion and when slows to 115 - not aware of HR in 130s   Inpatient Medications    Scheduled Meds: . apixaban  5 mg Oral BID  . carvedilol  12.5 mg Oral BID WC  . fluticasone  2 spray Each Nare Daily  . folic acid  1 mg Oral Daily  . furosemide  40 mg Oral Daily  . loratadine  10 mg Oral Daily  . multivitamin with minerals  1 tablet Oral Daily  . thiamine  100 mg Oral Daily   Or  . thiamine  100 mg Intravenous Daily   Continuous Infusions:  PRN Meds: acetaminophen **OR** acetaminophen, levalbuterol **AND** ipratropium, ondansetron **OR** ondansetron (ZOFRAN) IV   Vital Signs    Vitals:   01/14/18 0400 01/14/18 0500 01/14/18 0700 01/14/18 0725  BP: 100/71  110/84 110/84  Pulse: (!) 130  (!) 132 (!) 135  Resp: (!) 23  (!) 23   Temp:      TempSrc:      SpO2: 95%  98%   Weight:  241 lb 13.5 oz (109.7 kg)    Height:        Intake/Output Summary (Last 24 hours) at 01/14/2018 0736 Last data filed at 01/14/2018 0600 Gross per 24 hour  Intake 460 ml  Output 2020 ml  Net -1560 ml   Filed Weights   01/12/18 0500 01/13/18 0600 01/14/18 0500  Weight: 248 lb 7.3 oz (112.7 kg) 244 lb 11.4 oz (111 kg) 241 lb 13.5 oz (109.7 kg)    Telemetry    A flutter at 130-170 during the night with pauses - Personally Reviewed  ECG    No new - Personally Reviewed  Physical Exam   GEN: No acute distress.   Neck: No JVD Cardiac: RRR but rapid a flutter, no murmurs, rubs, or gallops.  Respiratory: Clear to auscultation bilaterally. GI: Soft, nontender, non-distended  MS: No edema; No deformity. Neuro:  Nonfocal  Psych: Normal affect   Labs    Chemistry Recent Labs  Lab 01/10/18 1308 01/11/18 0216 01/12/18 0319 01/13/18 0645 01/14/18 0303  NA 143 142 138 141 139  K 3.8 3.7 3.7 3.7 4.1  CL 113*  111 110 110 108  CO2 19* 20* 18* 21* 22  GLUCOSE 92 110* 111* 114* 103*  BUN 16 12 12 12 14   CREATININE 0.89 0.84 0.82 0.86 0.86  CALCIUM 9.0 8.8* 8.8* 9.2 9.0  PROT 6.5 5.9*  --   --   --   ALBUMIN 3.6 3.2*  --   --   --   AST 24 21  --   --   --   ALT 28 27  --   --   --   ALKPHOS 93 82  --   --   --   BILITOT 1.8* 1.9*  --   --   --   GFRNONAA >60 >60 >60 >60 >60  GFRAA >60 >60 >60 >60 >60  ANIONGAP 11 11 10 10 9      Hematology Recent Labs  Lab 01/12/18 0319 01/13/18 0645 01/14/18 0303  WBC 8.0 7.9 5.8  RBC 4.68 5.06 4.60  HGB 13.0 14.0 12.5*  HCT 39.3 42.8 39.1  MCV 84.0 84.6 85.0  MCH 27.8 27.7 27.2  MCHC 33.1 32.7 32.0  RDW 14.5  14.6 14.5  PLT 200 222 202    Cardiac Enzymes Recent Labs  Lab 01/10/18 2049 01/11/18 0216 01/11/18 0806  TROPONINI 0.04* 0.03* 0.03*    Recent Labs  Lab 01/10/18 1312  TROPIPOC 0.02     BNP Recent Labs  Lab 01/11/18 1557 01/13/18 0645  BNP 209.0* 170.8*     DDimer No results for input(s): DDIMER in the last 168 hours.   Radiology    No results found.  Cardiac Studies   Echo 01/11/18 Study Conclusions  - Left ventricle: The cavity size was moderately dilated. Wall thickness was normal. Systolic function was severely reduced. The estimated ejection fraction was in the range of 25% to 30%. Diffuse hypokinesis. The study is not technically sufficient to allow evaluation of LV diastolic function. Doppler parameters are consistent with high ventricular filling pressure. - Mitral valve: There was mild regurgitation. - Left atrium: The atrium was moderately to severely dilated. - Right ventricle: The cavity size was mildly dilated. - Right atrium: The atrium was mildly dilated. - Pericardium, extracardiac: A small pericardial effusion was identified. There was a left pleural effusion.  Impressions:  - Severe global reduction in LV systolic function; 4 chamber enlargement; mild MR and TR; small  pericardial effusion.     Patient Profile     37 y.o. male with no cardiac hx, no hx DM, HTN, HLD.No regular checkups.He has hx of seasonal allergies (wears a mask to mow) treated w/ OTC rx,tob use, THC and cocaine use now admitted with hyperthyroid and a flutter with RVR.  Echo with EF 25-30%.   Assessment & Plan    A flutter--on coreg 12.5 BID - pt aware would be dangerous to use cocaine with BB ---now on Eliquis 5 mg BID   Plan for correct hyperthyroidism then DCCV.  CHADSvasc 1 for reduced LV function --if pt with continued high rate may need DCCV prior to discharge --HR with exertion to 175 a flutter --hypotension difficult to give more than 12.5 coreg for HR.   Cardiomyopathy, most likely due to tachycardia, will increase BB as needed.  EF 25-30%, mild MR.  --unable to begin ACE or ARB due to hypotension  Acute systolic HF, on lasix 40 mg daily is neg 3,072 since admit and wt down from 247 to 241lbs. No SOB --BNP now 170 down from 209 --Cr 0.86 K+ 4.1 and Mg+ 1.9 --unable to begin ACE or ARB due to hypotension  Hyperthyroidism wt loss for 3 weeks,  --thyroid reuptake most likely will not be done until today or tomorrow. --cortisol level is 18.2   --plan to begin methimazole 10 mg TID, and then 30 mg daily on discharge --Dr. Janee Meadows discussed with Dr. Fayne Meadows.    For questions or updates, please contact [facility] Please consult [email or web address] for contact info under Cardiology/STEMI.   Signed, Logan Boozer, NP  01/14/2018, 7:36 AM    Patient seen, examined. Available data reviewed. Agree with findings, assessment, and plan as outlined by Logan Boozer, NP.   On exam the patient is alert and oriented in no distress.  Lungs are clear.  Heart is tachycardic and regular without murmur or gallop.  JVP is normal.  Abdomen is soft and nontender.  Extremities show no edema.  Telemetry reviewed demonstrates continued atrial flutter with a ventricular rate in the  130s.  Unable to push beta-blocker dose any higher.  He is feeling better but has had even more rapid rates with certain activities with heart rate up into  the 170s.  I think he will require cardioversion prior to hospital discharge.  This will need to be done under TEE guidance since the duration of his atrial flutter is unknown.  He finishes his thyroid uptake scan tomorrow and will start treatment with methimazole at that time.  Will arrange TEE cardioversion Thursday.  I have reviewed risks, indications, and alternatives with the patient who agrees to proceed as planned.  We will follow-up tomorrow.  Tonny Bollman, M.D. 01/14/2018 10:34 AM

## 2018-01-14 NOTE — Progress Notes (Signed)
PROGRESS NOTE    Logan Meadows  ZOX:[id number] DOB: October 08, 1980 DOA: 01/10/2018 PCP: Patient, No Pcp Per    Brief Narrative:  37 year old male history of allergies, history of cocaine and marijuana use presented to the ED with palpitations, diaphoresis, diarrhea, weight loss x3 weeks and noted at the urgent care center to be tachycardic and in A. fib.  Patient sent to the ED noted to be in A. fib/a flutter placed on a Cardizem drip and noted to be significantly hyperthyroidism.. Patient also noted to be in acute systolic heart failure.  Patient placed on IV diuretics and beta-blocker added to patient's regimen.  Patient diuresed well and IV diuretics transition to oral diuretics.  Patient awaiting thyroid reuptake scan prior to starting methimazole. Cardiology following.   Assessment & Plan:   Principal Problem:   Atrial flutter with rapid ventricular response (HCC) Active Problems:   Hyperthyroidism   Acute systolic (congestive) heart failure (HCC)   Hx of cocaine abuse   Atrial fibrillation with RVR (HCC)   Cardiomyopathy (HCC)  #1  Atrial flutter  CHA2DS2VASC = 1( DEPRESSED  EF) Likely secondary to primary hyperthyroidism.  Patient still tachycardic.  Heart rates going up into the 170s while taking a shower.  Cardizem drip has been discontinued.  Chest tightness and shortness of breath improved with diuresis.  Troponins minimally elevated at 0.03.  2D echo with a EF of 25 to 30% with diffuse hypokinesis, severe global reduction in left ventricular systolic function, four-chamber enlargement, mild MR and TR, small pericardial effusion.  LDL of 67.  Hemoglobin A1c of 5.4.  Coreg dose has been increased to 12.5 mg twice daily per cardiology.  Awaiting for thyroid reuptake scan to be done prior to starting patient on methimazole.  Patient started on methimazole and still unable to control heart rate may need DCCV however will defer that to cardiology.  Importance of cessation of  cocaine abuse stressed to patient who has agreed to stop using cocaine. Patient has been transitioned to Eliquis from heparin.  Keep magnesium greater than 2.  Keep potassium greater than 4.  Cardiology following and appreciate input and recommendations.    #2.  Hyperthyroidism ?? etiology.  Patient is afebrile, alert and oriented with no confusion, no nausea or vomiting, no abdominal pain.  Patient NOT IN thyroid storm at this time.  Concern for possible Graves' disease versus thyroiditis.  TSH was less than 0.010.  Free T4 was 4.13.  Free T3 was 8.8.  Thyroid reuptake scan has been ordered and currently pending and likely be done today and tomorrow( 01/14/2018- 01/15/2018) per RN.Marland Kitchen  Thyrotropin receptor antibodies pending.  Thyroid peroxidase antibody at 12, within normal limits. Cortisol level is 18.2. Will need to wait until thyroid reuptake scan has been completed prior to starting patient on methimazole 10 mg 3 times daily during this hospitalization and subsequently transitioning to methimazole 30 mg daily on discharge.  Currently no need for steroids at this time as patient NOT IN a thyroid storm.  Will need outpatient follow-up with endocrinology.  Case discussed with Dr. Sharl Ma of endocrinology.  #3 cardiomyopathy  2D echo with a EF of 25 to 30% with diffuse hypokinesis, severe global reduction in left ventricular systolic function, four-chamber enlargement, mild MR and TR, small pericardial effusion.  Fasting lipid panel with a LDL of 67.  Hemoglobin A1c 5.4.  Continue beta-blocker. Cardiology following.  #4.  Cocaine abuse Cocaine cessation stressed to patient.  Patient stated he would rather live.  5.  Acute systolic heart failure Likely secondary to atrial flutter.  Patient on 5/17/518 was complaining of some chest tightness on ambulation and choppy sentences on ambulation with some shortness of breath on minimal exertion.  BNP obtained was 209.  Chest x-ray obtained was consistent with volume  overload.  2D echo with a EF of 25 to 30% with diffuse hypokinesis, severe global reduction in left ventricular systolic function, four-chamber enlargement, mild MR and TR, small pericardial effusion.  LDL is 67.  Patient started on Lasix 20 mg IV every 12 hours.  Patient with a urine output of 2. 02 L overnight.  Current weight is 241 pounds from 244 pounds from 248 pounds from 250 pounds on admission.  Patient seems compensated at this time.  Patient has been transitioned from IV Lasix to oral Lasix 40 mg daily.  Cardizem has been discontinued.  Coreg dose has been increased.  Awaiting for thyroid reuptake scan to be done prior to starting patient on methimazole.  Continue Coreg.  Patient on Eliquis.  Per cardiology.       DVT prophylaxis: Eliquis Code Status: Full Family Communication: Updated patient.  No family at bedside. Disposition Plan: Remain in the stepdown unit.  Likely home once clinically improved and medically stable and after thyroid reuptake scan has been completed and patient started on methimazole, and per cardiology.   Consultants:   Cardiology: Dr. Eden Emms 01/10/2018  Procedures:   2D echo 01/11/2018  Chest x-ray 01/10/2018  Thyroid reuptake scan pending  Antimicrobials:   None   Subjective: Patient states last night when he went to have a shower noticed significant palpitations and heart rate noted to increase into the 170s.  Denies any chest tightness.  States shortness of breath has resolved.  No nausea or vomiting.  No abdominal cramping.  Alert and oriented x3.   HR noted to range from 130s to the 170s on telemetry.  Objective: Vitals:   01/14/18 0700 01/14/18 0725 01/14/18 0800 01/14/18 0900  BP: 110/84 110/84 (!) 91/57   Pulse: (!) 132 (!) 135 (!) 132 (!) 132  Resp: (!) 23  18 20   Temp:      TempSrc:      SpO2: 98%  98% 97%  Weight:      Height:        Intake/Output Summary (Last 24 hours) at 01/14/2018 0907 Last data filed at 01/14/2018 0600 Gross  per 24 hour  Intake 100 ml  Output 1640 ml  Net -1540 ml   Filed Weights   01/12/18 0500 01/13/18 0600 01/14/18 0500  Weight: 112.7 kg (248 lb 7.3 oz) 111 kg (244 lb 11.4 oz) 109.7 kg (241 lb 13.5 oz)    Examination:  General exam: Appears calm and comfortable Respiratory system: CTA B.  No wheezes, no crackles, no rhonchi.  Normal respiratory effort.   Cardiovascular system: Irregular.  Tachycardic.  No JVD.  No lower extremity edema.  No murmurs rubs or gallops.   Gastrointestinal system: Abdomen is soft, nontender, nondistended, positive bowel sounds.  No rebound.  No guarding.  Central nervous system: Alert and oriented. No focal neurological deficits. Extremities: Symmetric 5 x 5 power. Skin: No rashes, lesions or ulcers Psychiatry: Judgement and insight appear normal. Mood & affect appropriate.     Data Reviewed: I have personally reviewed following labs and imaging studies  CBC: Recent Labs  Lab 01/10/18 1308 01/11/18 0216 01/12/18 0319 01/13/18 0645 01/14/18 0303  WBC 7.8 8.6 8.0 7.9 5.8  NEUTROABS  4.5  --   --   --   --   HGB 14.2 13.5 13.0 14.0 12.5*  HCT 43.6 40.8 39.3 42.8 39.1  MCV 84.3 84.8 84.0 84.6 85.0  PLT 228 225 200 222 202   Basic Metabolic Panel: Recent Labs  Lab 01/10/18 1308 01/11/18 0216 01/12/18 0319 01/13/18 0645 01/14/18 0303  NA 143 142 138 141 139  K 3.8 3.7 3.7 3.7 4.1  CL 113* 111 110 110 108  CO2 19* 20* 18* 21* 22  GLUCOSE 92 110* 111* 114* 103*  BUN 16 12 12 12 14   CREATININE 0.89 0.84 0.82 0.86 0.86  CALCIUM 9.0 8.8* 8.8* 9.2 9.0  MG 1.8 1.7 1.8 1.8 1.9  PHOS 3.5 3.6  --   --   --    GFR: Estimated Creatinine Clearance: 156.5 mL/min (by C-G formula based on SCr of 0.86 mg/dL). Liver Function Tests: Recent Labs  Lab 01/10/18 1308 01/11/18 0216  AST 24 21  ALT 28 27  ALKPHOS 93 82  BILITOT 1.8* 1.9*  PROT 6.5 5.9*  ALBUMIN 3.6 3.2*   No results for input(s): LIPASE, AMYLASE in the last 168 hours. No results  for input(s): AMMONIA in the last 168 hours. Coagulation Profile: Recent Labs  Lab 01/10/18 1308  INR 1.29   Cardiac Enzymes: Recent Labs  Lab 01/10/18 2049 01/11/18 0216 01/11/18 0806  TROPONINI 0.04* 0.03* 0.03*   BNP (last 3 results) No results for input(s): PROBNP in the last 8760 hours. HbA1C: Recent Labs    01/12/18 0319  HGBA1C 5.4   CBG: No results for input(s): GLUCAP in the last 168 hours. Lipid Profile: Recent Labs    01/12/18 0319  CHOL 108  HDL 32*  LDLCALC 67  TRIG 47  CHOLHDL 3.4   Thyroid Function Tests: No results for input(s): TSH, T4TOTAL, FREET4, T3FREE, THYROIDAB in the last 72 hours. Anemia Panel: No results for input(s): VITAMINB12, FOLATE, FERRITIN, TIBC, IRON, RETICCTPCT in the last 72 hours. Sepsis Labs: Recent Labs  Lab 01/10/18 2049  LATICACIDVEN 1.2    Recent Results (from the past 240 hour(s))  MRSA PCR Screening     Status: None   Collection Time: 01/10/18  8:31 PM  Result Value Ref Range Status   MRSA by PCR NEGATIVE NEGATIVE Final    Comment:        The GeneXpert MRSA Assay (FDA approved for NASAL specimens only), is one component of a comprehensive MRSA colonization surveillance program. It is not intended to diagnose MRSA infection nor to guide or monitor treatment for MRSA infections. Performed at [facility], [address], [place], [place] [address]          Radiology Studies: No results found.      Scheduled Meds: . apixaban  5 mg Oral BID  . carvedilol  12.5 mg Oral BID WC  . fluticasone  2 spray Each Nare Daily  . folic acid  1 mg Oral Daily  . furosemide  40 mg Oral Daily  . loratadine  10 mg Oral Daily  . multivitamin with minerals  1 tablet Oral Daily  . thiamine  100 mg Oral Daily   Or  . thiamine  100 mg Intravenous Daily   Continuous Infusions: . magnesium sulfate 1 - 4 g bolus IVPB       LOS: 3 days    Time spent: 40 mins    Ramiro Harvest, MD [facility] Pager [phone]  If 7PM-7AM,  please contact night-coverage [email or web address] Password [email or web address] 01/14/2018, 9:07 AM

## 2018-01-14 NOTE — Progress Notes (Signed)
For TEE DCCV on Friday 01/17/18 with Dr. Eden Emms at 1300.

## 2018-01-15 ENCOUNTER — Inpatient Hospital Stay (HOSPITAL_COMMUNITY): Payer: BLUE CROSS/BLUE SHIELD

## 2018-01-15 LAB — CBC WITH DIFFERENTIAL/PLATELET
Basophils Absolute: 0 10*3/uL (ref 0.0–0.1)
Basophils Relative: 0 %
Eosinophils Absolute: 0.2 10*3/uL (ref 0.0–0.7)
Eosinophils Relative: 3 %
HEMATOCRIT: 40.1 % (ref 39.0–52.0)
HEMOGLOBIN: 13 g/dL (ref 13.0–17.0)
Lymphocytes Relative: 32 %
Lymphs Abs: 1.9 10*3/uL (ref 0.7–4.0)
MCH: 27.1 pg (ref 26.0–34.0)
MCHC: 32.4 g/dL (ref 30.0–36.0)
MCV: 83.5 fL (ref 78.0–100.0)
MONOS PCT: 12 %
Monocytes Absolute: 0.7 10*3/uL (ref 0.1–1.0)
NEUTROS ABS: 3.1 10*3/uL (ref 1.7–7.7)
NEUTROS PCT: 53 %
Platelets: 218 10*3/uL (ref 150–400)
RBC: 4.8 MIL/uL (ref 4.22–5.81)
RDW: 14.4 % (ref 11.5–15.5)
WBC: 5.9 10*3/uL (ref 4.0–10.5)

## 2018-01-15 LAB — BASIC METABOLIC PANEL
Anion gap: 8 (ref 5–15)
BUN: 14 mg/dL (ref 6–20)
CHLORIDE: 111 mmol/L (ref 101–111)
CO2: 22 mmol/L (ref 22–32)
CREATININE: 0.84 mg/dL (ref 0.61–1.24)
Calcium: 9 mg/dL (ref 8.9–10.3)
GFR calc Af Amer: >60 mL/min (ref >60)
GFR calc non Af Amer: >60 mL/min (ref >60)
Glucose, Bld: 103 mg/dL — ABNORMAL HIGH (ref 65–99)
Potassium: 4.4 mmol/L (ref 3.5–5.1)
Sodium: 141 mmol/L (ref 135–145)

## 2018-01-15 LAB — MAGNESIUM: Magnesium: 2 mg/dL (ref 1.7–2.4)

## 2018-01-15 MED ORDER — DIGOXIN 0.25 MG/ML IJ SOLN
0.5000 mg | Freq: Once | INTRAMUSCULAR | Status: AC
Start: 2018-01-15 — End: 2018-01-15
  Administered 2018-01-15: 0.5 mg via INTRAVENOUS
  Filled 2018-01-15: qty 2

## 2018-01-15 MED ORDER — DIGOXIN 0.25 MG/ML IJ SOLN
0.5000 mg | Freq: Once | INTRAMUSCULAR | Status: AC
  Administered 2018-01-15: 0.5 mg via INTRAVENOUS
  Filled 2018-01-15: qty 2

## 2018-01-15 MED ORDER — DIGOXIN 125 MCG PO TABS
0.2500 mg | ORAL_TABLET | Freq: Every day | ORAL | Status: DC
  Administered 2018-01-16: 0.25 mg via ORAL
  Filled 2018-01-15 (×2): qty 2

## 2018-01-15 MED ORDER — METHIMAZOLE 10 MG PO TABS
10.0000 mg | ORAL_TABLET | Freq: Two times a day (BID) | ORAL | Status: DC
  Administered 2018-01-15 – 2018-01-19 (×9): 10 mg via ORAL
  Filled 2018-01-15 (×9): qty 1

## 2018-01-15 NOTE — Progress Notes (Addendum)
Progress Note  Patient Name: Logan Meadows Date of Encounter: 01/15/2018  Primary Cardiologist: Charlton Haws, MD   Subjective   Asymptomatic currently with Vrates in the 130s.   Inpatient Medications    Scheduled Meds: . apixaban  5 mg Oral BID  . carvedilol  12.5 mg Oral BID WC  . fluticasone  2 spray Each Nare Daily  . folic acid  1 mg Oral Daily  . furosemide  40 mg Oral Daily  . loratadine  10 mg Oral Daily  . methimazole  10 mg Oral BID  . multivitamin with minerals  1 tablet Oral Daily  . sodium chloride flush  3 mL Intravenous Q12H  . thiamine  100 mg Oral Daily   Or  . thiamine  100 mg Intravenous Daily   Continuous Infusions: . sodium chloride     PRN Meds: acetaminophen **OR** acetaminophen, levalbuterol **AND** ipratropium, ondansetron **OR** ondansetron (ZOFRAN) IV, sodium chloride flush   Vital Signs    Vitals:   01/15/18 0748 01/15/18 0800 01/15/18 0900 01/15/18 0929  BP: 104/60 103/65 99/69 99/69   Pulse: (!) 131 (!) 132 (!) 133 (!) 134  Resp: (!) 30 18 10    Temp: 98 F (36.7 C)     TempSrc: Oral     SpO2: 98% 99% 97%   Weight:      Height:        Intake/Output Summary (Last 24 hours) at 01/15/2018 1222 Last data filed at 01/15/2018 0933 Gross per 24 hour  Intake 240 ml  Output 2130 ml  Net -1890 ml   Filed Weights   01/13/18 0600 01/14/18 0500 01/15/18 0500  Weight: 244 lb 11.4 oz (111 kg) 241 lb 13.5 oz (109.7 kg) 232 lb 5.8 oz (105.4 kg)    Telemetry    Atrial flutter in the 130s - Personally Reviewed  ECG    Atrial flutter w/ RVR - Personally Reviewed  Physical Exam   GEN: No acute distress.   Neck: No JVD Cardiac: irregular rhythm, tachy rate, no murmurs, rubs, or gallops.  Respiratory: Clear to auscultation bilaterally. GI: Soft, nontender, non-distended  MS: No edema; No deformity. Neuro:  Nonfocal  Psych: Normal affect   Labs    Chemistry Recent Labs  Lab 01/10/18 1308 01/11/18 0216  01/13/18 0645  01/14/18 0303 01/15/18 0323  NA 143 142   < > 141 139 141  K 3.8 3.7   < > 3.7 4.1 4.4  CL 113* 111   < > 110 108 111  CO2 19* 20*   < > 21* 22 22  GLUCOSE 92 110*   < > 114* 103* 103*  BUN 16 12   < > 12 14 14   CREATININE 0.89 0.84   < > 0.86 0.86 0.84  CALCIUM 9.0 8.8*   < > 9.2 9.0 9.0  PROT 6.5 5.9*  --   --   --   --   ALBUMIN 3.6 3.2*  --   --   --   --   AST 24 21  --   --   --   --   ALT 28 27  --   --   --   --   ALKPHOS 93 82  --   --   --   --   BILITOT 1.8* 1.9*  --   --   --   --   GFRNONAA >60 >60   < > >60 >60 >60  GFRAA >60 >60   < > >  60 >60 >60  ANIONGAP 11 11   < > 10 9 8    < > = values in this interval not displayed.     Hematology Recent Labs  Lab 01/13/18 0645 01/14/18 0303 01/15/18 0323  WBC 7.9 5.8 5.9  RBC 5.06 4.60 4.80  HGB 14.0 12.5* 13.0  HCT 42.8 39.1 40.1  MCV 84.6 85.0 83.5  MCH 27.7 27.2 27.1  MCHC 32.7 32.0 32.4  RDW 14.6 14.5 14.4  PLT 222 202 218    Cardiac Enzymes Recent Labs  Lab 01/10/18 2049 01/11/18 0216 01/11/18 0806  TROPONINI 0.04* 0.03* 0.03*    Recent Labs  Lab 01/10/18 1312  TROPIPOC 0.02     BNP Recent Labs  Lab 01/11/18 1557 01/13/18 0645  BNP 209.0* 170.8*     DDimer No results for input(s): DDIMER in the last 168 hours.   Radiology    Nm Thyroid Mult Uptake W/imaging  Result Date: 01/15/2018 CLINICAL DATA:  Hyperthyroidism, heart palpitations, weight loss EXAM: THYROID SCAN AND UPTAKE - 4 AND 24 HOURS TECHNIQUE: Following oral administration of I-123 capsule, anterior planar imaging was acquired at 24 hours. Thyroid uptake was calculated with a thyroid probe at 4-6 hours and 24 hours. RADIOPHARMACEUTICALS:  293 uCi I-123 sodium iodide p.o. COMPARISON:  None FINDINGS: Homogeneous tracer distribution throughout both thyroid lobes. No focal areas of increased or decreased tracer localization seen. 4 hour I-123 uptake = 33.2% (normal 5-20%) 24 hour I-123 uptake = 54.1% (normal 10-30%) IMPRESSION: Normal  thyroid scan. Elevated 4 hour and 24 hour radio iodine uptakes consistent with hyperthyroidism. Findings consistent with Graves disease. Electronically Signed   By: Ulyses Southward M.D.   On: 01/15/2018 10:19    Cardiac Studies   Echo 01/11/18 Study Conclusions  - Left ventricle: The cavity size was moderately dilated. Wall thickness was normal. Systolic function was severely reduced. The estimated ejection fraction was in the range of 25% to 30%. Diffuse hypokinesis. The study is not technically sufficient to allow evaluation of LV diastolic function. Doppler parameters are consistent with high ventricular filling pressure. - Mitral valve: There was mild regurgitation. - Left atrium: The atrium was moderately to severely dilated. - Right ventricle: The cavity size was mildly dilated. - Right atrium: The atrium was mildly dilated. - Pericardium, extracardiac: A small pericardial effusion was identified. There was a left pleural effusion.  Impressions:  - Severe global reduction in LV systolic function; 4 chamber enlargement; mild MR and TR; small pericardial effusion.   Patient Profile     37 y.o. male with no cardiac hx, no hx DM, HTN, HLD.No regular checkups.He has hx of seasonal allergies (wears a mask to mow) treated w/ OTC rx,tob use, THC and cocaine usenow admitted with hyperthyroid and a flutter with RVR. Echo with EF 25-30%.  Assessment & Plan    1. Atrial Flutter w/ RVR: Vrates remain in the 130s, in the setting of hyperthyroidism, being treated with methimazole. He is luckily asymptomatic. Rate control treatment limited due to soft BP. He is on medium dose Coreg. Avoiding Cardizem due to low EF. Avoiding Amiodarone due to hyperthyroidism. ? Addition of digoxin given low EF. Given he is in flutter, his best option will be rhythm control. Plan is for DCCV on Friday. He will need a TEE given unknown duration of his flutter. He is on Eliquis for a/c. Continue  to monitor on tele. Continue treatment of hyperthyroidism, per IM. Monitor electrolytes.   2. Cardiomyopathy: EF reduced at 25-30%.  Likely tachy mediated from rapid atrial flutter. Continue BB. BPs have been soft, thus no ACE/ARB initiated. We can repeat echo several weeks after NSR is restored.   3. Acute Systolic HF: treatment with Lasix. Good response. I/Os net negative 5.3L since admit. No resting dyspnea. Still with mild orthopnea and continues to sleep with head of bed elevated. Continue PO Lasix.   4. Hyperthyroidism: TSH < 0.010. Free T4 4.13. Free T3 8.8. Concern for possible Graves disease vs thyroiditis. Thyroid uptake scan completed. Results pending.   For questions or updates, please contact [facility] Please consult [email or web address] for contact info under Cardiology/STEMI.   Signed, Robbie Lis, PA-C  01/15/2018, 12:22 PM    Patient seen, examined. Available data reviewed. Agree with findings, assessment, and plan as outlined by Robbie Lis, PA-C. On exam there are no changes compared to yesterday's exam. JVP nl, lungs CTA, heart tachy with soft SEM, no diastolic murmur, abdomen soft, NT, extremities without edema. Will add digoxin today to try for better HR control. Plans for TEE/DCCV Friday reviewed with patient. Continue apixaban. Treatment of thyroid dz per [facility] team.  Tonny Bollman, M.D. 01/15/2018 1:36 PM

## 2018-01-15 NOTE — Progress Notes (Signed)
[facility] Progress Note  Patient: Logan Meadows MKL:[id number]   PCP: Patient, No Pcp Per DOB: 1980-09-13   DOA: 01/10/2018   DOS: 01/15/2018   Date of Service: the patient was seen and examined on 01/15/2018  Subjective: Feeling better, no acute complaint, breathing is better.  No chest pain.  Occasional palpitation.  No nausea no vomiting.  Brief hospital course: Pt. with PMH of cocaine abuse marijuana abuse; admitted on 01/10/2018, presented with complaint of palpitation, diaphoresis, diarrhea and weight loss, was found to have Graves' disease with hyperthyroidism without thyrotoxicosis.  Also had A.  Flutter. Currently further plan is continue management per cardiology.  Assessment and Plan: 1.  Atrial flutter with RVR. Type of the flutter not defined. Heart rate remains 1 130s. In the setting of hypothyroidism. Patient remains asymptomatic though. Started on Cardizem and Coreg, Cardizem was discontinued due to low EF. Amiodarone is not used due to presence of hypothyroidism. Cardiology was consulted. Started on apixaban for anticoagulation. Scheduled for TEE cardioversion on Friday.  2.  Tachycardia induced cardiomyopathy. Acute systolic CHF. Presented with shortness of breath, echocardiogram was performed, EF is 24 to 30%. Likely from tachycardia due to rapid atrial flutter. Started on Cardizem and Coreg, Cardizem discontinued due to low EF. Now continuing Coreg. Unable to add any other medication due to patient's low blood pressure. Digoxin added today. Received IV Lasix, now transition to oral Lasix. Monitor daily weight and in and out.  3.  Hypothyroidism. Graves' disease. TSH is undetectable, free T4 is 4.13, free T3 is 8.8. Radioactive iodine uptake performed, shows evidence of normal study suggesting Graves' disease. We will start the patient on methimazole. Given his moderate to severe symptoms we will start him on 20 mg daily dose in divided  doses. Monitor response. Not in thyroid storm, no indication for steroids. Already on beta-blocker.  4.  Cocaine abuse. Marijuana abuse. Cessation stressed to the patient. Outpatient monitoring.  Diet: Cardiac diet DVT Prophylaxis: on therapeutic anticoagulation.  Advance goals of care discussion: FULL CODE  Family Communication: no family was present at bedside, at the time of interview.   Disposition:  Discharge to home.  Consultants: cardiology , phone consultation with Dr. Sharl Ma from endocrinology Procedures: Echocardiogram   Antibiotics: Anti-infectives (From admission, onward)   None       Objective: Physical Exam: Vitals:   01/15/18 1000 01/15/18 1200 01/15/18 1215 01/15/18 1600  BP: (!) 117/93  (!) 120/95 103/68  Pulse: (!) 134  (!) 129 (!) 132  Resp: (!) 23  (!) 32 (!) 29  Temp:  98.4 F (36.9 C)    TempSrc:  Oral    SpO2: 97%  99% 100%  Weight:      Height:        Intake/Output Summary (Last 24 hours) at 01/15/2018 1637 Last data filed at 01/15/2018 0933 Gross per 24 hour  Intake 240 ml  Output 1730 ml  Net -1490 ml   Filed Weights   01/13/18 0600 01/14/18 0500 01/15/18 0500  Weight: 111 kg (244 lb 11.4 oz) 109.7 kg (241 lb 13.5 oz) 105.4 kg (232 lb 5.8 oz)   General: Alert, Awake and Oriented to Time, Place and Person. Appear in mild distress, affect appropriate Eyes: PERRL, Conjunctiva normal ENT: Oral Mucosa clear moist. Neck: no JVD, no Abnormal Mass Or lumps Cardiovascular: S1 and S2 Present, no Murmur, Peripheral Pulses Present Respiratory: normal respiratory effort, Bilateral Air entry equal and Decreased, no use of accessory muscle, Clear to Auscultation,  no Crackles, no wheezes Abdomen: Bowel Sound present, Soft and no tenderness, no hernia Skin: no redness, no Rash, no induration Extremities: no Pedal edema, no calf tenderness Neurologic: Grossly no focal neuro deficit. Bilaterally Equal motor strength  Data Reviewed: CBC: Recent  Labs  Lab 01/10/18 1308 01/11/18 0216 01/12/18 0319 01/13/18 0645 01/14/18 0303 01/15/18 0323  WBC 7.8 8.6 8.0 7.9 5.8 5.9  NEUTROABS 4.5  --   --   --   --  3.1  HGB 14.2 13.5 13.0 14.0 12.5* 13.0  HCT 43.6 40.8 39.3 42.8 39.1 40.1  MCV 84.3 84.8 84.0 84.6 85.0 83.5  PLT 228 225 200 222 202 218   Basic Metabolic Panel: Recent Labs  Lab 01/10/18 1308 01/11/18 0216 01/12/18 0319 01/13/18 0645 01/14/18 0303 01/15/18 0323  NA 143 142 138 141 139 141  K 3.8 3.7 3.7 3.7 4.1 4.4  CL 113* 111 110 110 108 111  CO2 19* 20* 18* 21* 22 22  GLUCOSE 92 110* 111* 114* 103* 103*  BUN 16 12 12 12 14 14   CREATININE 0.89 0.84 0.82 0.86 0.86 0.84  CALCIUM 9.0 8.8* 8.8* 9.2 9.0 9.0  MG 1.8 1.7 1.8 1.8 1.9 2.0  PHOS 3.5 3.6  --   --   --   --     Liver Function Tests: Recent Labs  Lab 01/10/18 1308 01/11/18 0216  AST 24 21  ALT 28 27  ALKPHOS 93 82  BILITOT 1.8* 1.9*  PROT 6.5 5.9*  ALBUMIN 3.6 3.2*   No results for input(s): LIPASE, AMYLASE in the last 168 hours. No results for input(s): AMMONIA in the last 168 hours. Coagulation Profile: Recent Labs  Lab 01/10/18 1308  INR 1.29   Cardiac Enzymes: Recent Labs  Lab 01/10/18 2049 01/11/18 0216 01/11/18 0806  TROPONINI 0.04* 0.03* 0.03*   BNP (last 3 results) No results for input(s): PROBNP in the last 8760 hours. CBG: No results for input(s): GLUCAP in the last 168 hours. Studies: Nm Thyroid Mult Uptake W/imaging  Result Date: 01/15/2018 CLINICAL DATA:  Hyperthyroidism, heart palpitations, weight loss EXAM: THYROID SCAN AND UPTAKE - 4 AND 24 HOURS TECHNIQUE: Following oral administration of I-123 capsule, anterior planar imaging was acquired at 24 hours. Thyroid uptake was calculated with a thyroid probe at 4-6 hours and 24 hours. RADIOPHARMACEUTICALS:  293 uCi I-123 sodium iodide p.o. COMPARISON:  None FINDINGS: Homogeneous tracer distribution throughout both thyroid lobes. No focal areas of increased or decreased  tracer localization seen. 4 hour I-123 uptake = 33.2% (normal 5-20%) 24 hour I-123 uptake = 54.1% (normal 10-30%) IMPRESSION: Normal thyroid scan. Elevated 4 hour and 24 hour radio iodine uptakes consistent with hyperthyroidism. Findings consistent with Graves disease. Electronically Signed   By: Ulyses Southward M.D.   On: 01/15/2018 10:19    Scheduled Meds: . apixaban  5 mg Oral BID  . carvedilol  12.5 mg Oral BID WC  . digoxin  0.5 mg Intravenous Once  . [START ON 01/16/2018] digoxin  0.25 mg Oral Daily  . fluticasone  2 spray Each Nare Daily  . folic acid  1 mg Oral Daily  . furosemide  40 mg Oral Daily  . loratadine  10 mg Oral Daily  . methimazole  10 mg Oral BID  . multivitamin with minerals  1 tablet Oral Daily  . sodium chloride flush  3 mL Intravenous Q12H  . thiamine  100 mg Oral Daily   Or  . thiamine  100 mg Intravenous  Daily   Continuous Infusions: . sodium chloride     PRN Meds: acetaminophen **OR** acetaminophen, levalbuterol **AND** ipratropium, ondansetron **OR** ondansetron (ZOFRAN) IV, sodium chloride flush  Time spent: 35 minutes  Author: Lynden Oxford, MD [facility] Pager: [phone] 01/15/2018 4:37 PM  If 7PM-7AM, please contact night-coverage at [email or web address], password [facility]

## 2018-01-16 LAB — BASIC METABOLIC PANEL
Anion gap: 9 (ref 5–15)
BUN: 13 mg/dL (ref 6–20)
CHLORIDE: 108 mmol/L (ref 101–111)
CO2: 22 mmol/L (ref 22–32)
CREATININE: 0.74 mg/dL (ref 0.61–1.24)
Calcium: 9.5 mg/dL (ref 8.9–10.3)
GFR calc Af Amer: >60 mL/min (ref >60)
GFR calc non Af Amer: >60 mL/min (ref >60)
GLUCOSE: 98 mg/dL (ref 65–99)
Potassium: 4.4 mmol/L (ref 3.5–5.1)
SODIUM: 139 mmol/L (ref 135–145)

## 2018-01-16 LAB — MAGNESIUM: MAGNESIUM: 1.9 mg/dL (ref 1.7–2.4)

## 2018-01-16 MED ORDER — SODIUM CHLORIDE 0.9% FLUSH: 3.0000 mL | INTRAVENOUS | Status: DC | PRN

## 2018-01-16 MED ORDER — SODIUM CHLORIDE 0.9 % IV SOLN: 250.0000 mL | INTRAVENOUS | Status: DC

## 2018-01-16 MED ORDER — SODIUM CHLORIDE 0.9% FLUSH
3.0000 mL | Freq: Two times a day (BID) | INTRAVENOUS | Status: DC
  Administered 2018-01-16 – 2018-01-19 (×6): 3 mL via INTRAVENOUS

## 2018-01-16 NOTE — Progress Notes (Signed)
Patient scheduled for TEE/cardioversion at 1300 at [facility].  Called patient's nurse, Ruby, about procedure time.  RN to call [facility] to schedule transport to arrive to [facility] at 1130.  Roselie Awkward, RN

## 2018-01-16 NOTE — Progress Notes (Signed)
[facility] Progress Note  Patient: Logan Meadows   PCP: Patient, No Pcp Per DOB: 16-May-1981   DOA: 01/10/2018   DOS: 01/16/2018   Date of Service: the patient was seen and examined on 01/16/2018  Subjective: No acute complaint, still feeling fatigue, reports once a day loose BM no blood in it.  No abdominal pain no nausea no vomiting.  Brief hospital course: Pt. with PMH of cocaine abuse marijuana abuse; admitted on 01/10/2018, presented with complaint of palpitation, diaphoresis, diarrhea and weight loss, was found to have Graves' disease with hyperthyroidism without thyrotoxicosis.  Also had A.  Flutter. Currently further plan is continue management per cardiology.  Assessment and Plan: 1.  Atrial flutter with RVR. Type of the flutter not defined. Heart rate remains 1 130s. In the setting of hypothyroidism. Patient remains asymptomatic though. Started on Cardizem and Coreg, Cardizem was discontinued due to low EF. Amiodarone is not used due to presence of hypothyroidism. Cardiology was consulted. Started on apixaban for anticoagulation. Scheduled for TEE cardioversion on Friday.  2.  Tachycardia induced cardiomyopathy. Acute systolic CHF. Presented with shortness of breath, echocardiogram was performed, EF is 25 to 30%. Likely from tachycardia due to rapid atrial flutter. Started on Cardizem and Coreg, Cardizem discontinued due to low EF. Now continuing Coreg. Unable to add any other medication due to patient's low blood pressure. Digoxin added today. Received IV Lasix, now transition to oral Lasix. Monitor daily weight and in and out.  3.  Hypothyroidism. Graves' disease. TSH is undetectable, free T4 is 4.13, free T3 is 8.8. Radioactive iodine uptake performed, shows evidence of normal study suggesting Graves' disease. We will start the patient on methimazole. Given his moderate to severe symptoms we will start him on 20 mg daily dose in divided  doses. Tolerating it very well so far.  Will monitor. Not in thyroid storm, no indication for steroids. Already on beta-blocker.  4.  Cocaine abuse. Marijuana abuse. Cessation stressed to the patient. Outpatient monitoring. Patient willing to quit using cocaine.  5.  Loose BM. No diarrhea. Patient reports this is present before admission. Secondary to hyperthyroidism. Should improve once hyperthyroidism get better. Monitor.  Diet: Cardiac diet, n.p.o. after midnight for TEE test tomorrow. DVT Prophylaxis: on therapeutic anticoagulation.  Advance goals of care discussion: full code  Family Communication: no family was present at bedside, at the time of interview.   Disposition:  Discharge to home.  Consultants: cardiology  Procedures: Echocardiogram   Antibiotics: Anti-infectives (From admission, onward)   None       Objective: Physical Exam: Vitals:   01/16/18 0600 01/16/18 0700 01/16/18 0747 01/16/18 0800  BP: 116/73  98/66 106/62  Pulse: (!) 133  (!) 136 (!) 134  Resp: 18   (!) 21  Temp:    98.1 F (36.7 C)  TempSrc:    Oral  SpO2: 100%   99%  Weight:  106.8 kg (235 lb 7.2 oz)    Height:        Intake/Output Summary (Last 24 hours) at 01/16/2018 0943 Last data filed at 01/16/2018 0800 Gross per 24 hour  Intake 383 ml  Output 1350 ml  Net -967 ml   Filed Weights   01/14/18 0500 01/15/18 0500 01/16/18 0700  Weight: 109.7 kg (241 lb 13.5 oz) 105.4 kg (232 lb 5.8 oz) 106.8 kg (235 lb 7.2 oz)   General: Alert, Awake and Oriented to Time, Place and Person. Appear in mild distress, affect flat Eyes: PERRL, Conjunctiva  normal ENT: Oral Mucosa clear moist. Neck: no JVD, no Abnormal Mass Or lumps Cardiovascular: S1 and S2 Present, no Murmur, Peripheral Pulses Present Respiratory: normal respiratory effort, Bilateral Air entry equal and Decreased, no use of accessory muscle, Clear to Auscultation, no Crackles, no wheezes Abdomen: Bowel Sound present, Soft  and no tenderness, no hernia Skin: no redness, no Rash, no induration Extremities: no Pedal edema, no calf tenderness Neurologic: Grossly no focal neuro deficit. Bilaterally Equal motor strength  Data Reviewed: CBC: Recent Labs  Lab 01/10/18 1308 01/11/18 0216 01/12/18 0319 01/13/18 0645 01/14/18 0303 01/15/18 0323  WBC 7.8 8.6 8.0 7.9 5.8 5.9  NEUTROABS 4.5  --   --   --   --  3.1  HGB 14.2 13.5 13.0 14.0 12.5* 13.0  HCT 43.6 40.8 39.3 42.8 39.1 40.1  MCV 84.3 84.8 84.0 84.6 85.0 83.5  PLT 228 225 200 222 202 218   Basic Metabolic Panel: Recent Labs  Lab 01/10/18 1308 01/11/18 0216 01/12/18 0319 01/13/18 0645 01/14/18 0303 01/15/18 0323 01/16/18 0745  NA 143 142 138 141 139 141 139  K 3.8 3.7 3.7 3.7 4.1 4.4 4.4  CL 113* 111 110 110 108 111 108  CO2 19* 20* 18* 21* 22 22 22   GLUCOSE 92 110* 111* 114* 103* 103* 98  BUN 16 12 12 12 14 14 13   CREATININE 0.89 0.84 0.82 0.86 0.86 0.84 0.74  CALCIUM 9.0 8.8* 8.8* 9.2 9.0 9.0 9.5  MG 1.8 1.7 1.8 1.8 1.9 2.0 1.9  PHOS 3.5 3.6  --   --   --   --   --     Liver Function Tests: Recent Labs  Lab 01/10/18 1308 01/11/18 0216  AST 24 21  ALT 28 27  ALKPHOS 93 82  BILITOT 1.8* 1.9*  PROT 6.5 5.9*  ALBUMIN 3.6 3.2*   No results for input(s): LIPASE, AMYLASE in the last 168 hours. No results for input(s): AMMONIA in the last 168 hours. Coagulation Profile: Recent Labs  Lab 01/10/18 1308  INR 1.29   Cardiac Enzymes: Recent Labs  Lab 01/10/18 2049 01/11/18 0216 01/11/18 0806  TROPONINI 0.04* 0.03* 0.03*   BNP (last 3 results) No results for input(s): PROBNP in the last 8760 hours. CBG: No results for input(s): GLUCAP in the last 168 hours. Studies: Nm Thyroid Mult Uptake W/imaging  Result Date: 01/15/2018 CLINICAL DATA:  Hyperthyroidism, heart palpitations, weight loss EXAM: THYROID SCAN AND UPTAKE - 4 AND 24 HOURS TECHNIQUE: Following oral administration of I-123 capsule, anterior planar imaging was  acquired at 24 hours. Thyroid uptake was calculated with a thyroid probe at 4-6 hours and 24 hours. RADIOPHARMACEUTICALS:  293 uCi I-123 sodium iodide p.o. COMPARISON:  None FINDINGS: Homogeneous tracer distribution throughout both thyroid lobes. No focal areas of increased or decreased tracer localization seen. 4 hour I-123 uptake = 33.2% (normal 5-20%) 24 hour I-123 uptake = 54.1% (normal 10-30%) IMPRESSION: Normal thyroid scan. Elevated 4 hour and 24 hour radio iodine uptakes consistent with hyperthyroidism. Findings consistent with Graves disease. Electronically Signed   By: Ulyses Southward M.D.   On: 01/15/2018 10:19    Scheduled Meds: . apixaban  5 mg Oral BID  . carvedilol  12.5 mg Oral BID WC  . digoxin  0.25 mg Oral Daily  . fluticasone  2 spray Each Nare Daily  . folic acid  1 mg Oral Daily  . furosemide  40 mg Oral Daily  . loratadine  10 mg Oral Daily  .  methimazole  10 mg Oral BID  . multivitamin with minerals  1 tablet Oral Daily  . sodium chloride flush  3 mL Intravenous Q12H  . thiamine  100 mg Oral Daily   Or  . thiamine  100 mg Intravenous Daily   Continuous Infusions: . sodium chloride     PRN Meds: acetaminophen **OR** acetaminophen, levalbuterol **AND** ipratropium, ondansetron **OR** ondansetron (ZOFRAN) IV, sodium chloride flush  Time spent: 30 minutes  Author: Lynden Oxford, MD [facility] Pager: [phone] 01/16/2018 9:43 AM  If 7PM-7AM, please contact night-coverage at [email or web address], password [facility]

## 2018-01-16 NOTE — Progress Notes (Addendum)
Progress Note  Patient Name: Logan Meadows Date of Encounter: 01/16/2018  Primary Cardiologist: Charlton Haws, MD   Subjective   Currently asymptomatic despite persistent atrial flutter w/ RVR in the 130s. He denies palpitations, dyspnea, CP, dizziness, syncope/ near syncope.   Inpatient Medications    Scheduled Meds: . apixaban  5 mg Oral BID  . carvedilol  12.5 mg Oral BID WC  . digoxin  0.25 mg Oral Daily  . fluticasone  2 spray Each Nare Daily  . folic acid  1 mg Oral Daily  . furosemide  40 mg Oral Daily  . loratadine  10 mg Oral Daily  . methimazole  10 mg Oral BID  . multivitamin with minerals  1 tablet Oral Daily  . sodium chloride flush  3 mL Intravenous Q12H  . thiamine  100 mg Oral Daily   Or  . thiamine  100 mg Intravenous Daily   Continuous Infusions: . sodium chloride     PRN Meds: acetaminophen **OR** acetaminophen, levalbuterol **AND** ipratropium, ondansetron **OR** ondansetron (ZOFRAN) IV, sodium chloride flush   Vital Signs    Vitals:   01/16/18 0600 01/16/18 0700 01/16/18 0747 01/16/18 0800  BP: 116/73  98/66 106/62  Pulse: (!) 133  (!) 136 (!) 134  Resp: 18   (!) 21  Temp:    98.1 F (36.7 C)  TempSrc:    Oral  SpO2: 100%   99%  Weight:  235 lb 7.2 oz (106.8 kg)    Height:        Intake/Output Summary (Last 24 hours) at 01/16/2018 1014 Last data filed at 01/16/2018 0800 Gross per 24 hour  Intake 383 ml  Output 1350 ml  Net -967 ml   Filed Weights   01/14/18 0500 01/15/18 0500 01/16/18 0700  Weight: 241 lb 13.5 oz (109.7 kg) 232 lb 5.8 oz (105.4 kg) 235 lb 7.2 oz (106.8 kg)    Telemetry    Atrial flutter w/ RVR in the 130s - Personally Reviewed  ECG    Atrial flutter w/ RVR - Personally Reviewed  Physical Exam   GEN: No acute distress.   Neck: No JVD Cardiac: irregiular rhythm, tachy rate, no murmurs, rubs, or gallops.  Respiratory: Clear to auscultation bilaterally. GI: Soft, nontender, non-distended  MS: No edema;  No deformity. Neuro:  Nonfocal  Psych: Normal affect   Labs    Chemistry Recent Labs  Lab 01/10/18 1308 01/11/18 0216  01/14/18 0303 01/15/18 0323 01/16/18 0745  NA 143 142   < > 139 141 139  K 3.8 3.7   < > 4.1 4.4 4.4  CL 113* 111   < > 108 111 108  CO2 19* 20*   < > 22 22 22   GLUCOSE 92 110*   < > 103* 103* 98  BUN 16 12   < > 14 14 13   CREATININE 0.89 0.84   < > 0.86 0.84 0.74  CALCIUM 9.0 8.8*   < > 9.0 9.0 9.5  PROT 6.5 5.9*  --   --   --   --   ALBUMIN 3.6 3.2*  --   --   --   --   AST 24 21  --   --   --   --   ALT 28 27  --   --   --   --   ALKPHOS 93 82  --   --   --   --   BILITOT 1.8* 1.9*  --   --   --   --  GFRNONAA >60 >60   < > >60 >60 >60  GFRAA >60 >60   < > >60 >60 >60  ANIONGAP 11 11   < > 9 8 9    < > = values in this interval not displayed.     Hematology Recent Labs  Lab 01/13/18 0645 01/14/18 0303 01/15/18 0323  WBC 7.9 5.8 5.9  RBC 5.06 4.60 4.80  HGB 14.0 12.5* 13.0  HCT 42.8 39.1 40.1  MCV 84.6 85.0 83.5  MCH 27.7 27.2 27.1  MCHC 32.7 32.0 32.4  RDW 14.6 14.5 14.4  PLT 222 202 218    Cardiac Enzymes Recent Labs  Lab 01/10/18 2049 01/11/18 0216 01/11/18 0806  TROPONINI 0.04* 0.03* 0.03*    Recent Labs  Lab 01/10/18 1312  TROPIPOC 0.02     BNP Recent Labs  Lab 01/11/18 1557 01/13/18 0645  BNP 209.0* 170.8*     DDimer No results for input(s): DDIMER in the last 168 hours.   Radiology    Nm Thyroid Mult Uptake W/imaging  Result Date: 01/15/2018 CLINICAL DATA:  Hyperthyroidism, heart palpitations, weight loss EXAM: THYROID SCAN AND UPTAKE - 4 AND 24 HOURS TECHNIQUE: Following oral administration of I-123 capsule, anterior planar imaging was acquired at 24 hours. Thyroid uptake was calculated with a thyroid probe at 4-6 hours and 24 hours. RADIOPHARMACEUTICALS:  293 uCi I-123 sodium iodide p.o. COMPARISON:  None FINDINGS: Homogeneous tracer distribution throughout both thyroid lobes. No focal areas of increased or  decreased tracer localization seen. 4 hour I-123 uptake = 33.2% (normal 5-20%) 24 hour I-123 uptake = 54.1% (normal 10-30%) IMPRESSION: Normal thyroid scan. Elevated 4 hour and 24 hour radio iodine uptakes consistent with hyperthyroidism. Findings consistent with Graves disease. Electronically Signed   By: Ulyses Southward M.D.   On: 01/15/2018 10:19    Cardiac Studies   2D Echo 01/11/18 Study Conclusions  - Left ventricle: The cavity size was moderately dilated. Wall   thickness was normal. Systolic function was severely reduced. The   estimated ejection fraction was in the range of 25% to 30%.   Diffuse hypokinesis. The study is not technically sufficient to   allow evaluation of LV diastolic function. Doppler parameters are   consistent with high ventricular filling pressure. - Mitral valve: There was mild regurgitation. - Left atrium: The atrium was moderately to severely dilated. - Right ventricle: The cavity size was mildly dilated. - Right atrium: The atrium was mildly dilated. - Pericardium, extracardiac: A small pericardial effusion was   identified. There was a left pleural effusion.  Impressions:  - Severe global reduction in LV systolic function; 4 chamber   enlargement; mild MR and TR; small pericardial effusion.  Patient Profile   37 y.o.malewith no cardiac hx, no hx DM, HTN, HLD.No regular checkups.He has hx of seasonal allergies (wears a mask to mow) treated w/ OTC rx,tob use, THC and cocaine usenow admitted with hyperthyroid and a flutter with RVR. Echo with EF 25-30%.  Assessment & Plan    1. Persistent Atrial Flutter w/ RVR: in the setting of hyperthyroidism, now on methimazole. Rates elevated in the 130s, but pt is asymptomatic. BP soft but stable.Low BP has only allowed for low dose BB. No Cardizem given low EF. No amiodarone due to thyroid disease. Digoxin was added yesterday with little improvement. Will discontinue digoxin and continue BB. Plan for TEE  guided DCCV tomorrow at [facility]. Continue Eliquis for a/c. NPO at midnight.   2. Cardiomyopathy: EF 25-30%. Suspect CM is  tachy mediated by rapid atrial flutter. Continue BB therapy with Coreg. BP remains too soft for ACE/ARB/ Entresto initiation at this time. Hopefully BP will improve if able to cardiovert back in NSR. We can further push HF regimen, if BP allows and plan to recheck echo in several weeks.   3. Acute Systolic HF: euvolemic on exam. No dyspnea.   4. Hyperthyroidism: TSH undetectable and Free T4 4.13 and Free T3 8.8. Radioactive iodine uptake normal, suggesting Grave's disease. He has been started on methimazole. Management per IM.   For questions or updates, please contact [facility] Please consult [email or web address] for contact info under Cardiology/STEMI.     Signed, Robbie Lis, PA-C  01/16/2018, 10:14 AM    Patient seen, examined. Available data reviewed. Agree with findings, assessment, and plan as outlined by Robbie Lis, PA-C.  The patient is independently interviewed and examined.  He is alert and oriented in no distress.  JVP is normal, lungs are clear, heart is tachycardic and regular with no murmur or gallop, abdomen is soft and nontender, extremities show trace edema.  He remains stable but continues in atrial flutter with RVR.  Considering his severe cardiomyopathy, it is reasonable to try to restore sinus rhythm even though his thyroid disease has not had time to be completely treated.  He is tolerating carvedilol.  There is no change in his heart rate after digoxin loading and I am going to discontinue this medicine today since it has not been effective.  Plans for TEE/cardioversion tomorrow as outlined.  Risks, indications, and alternatives have been reviewed with the patient.  He is tolerating apixaban now for several days without bleeding problems.  Tonny Bollman, M.D. 01/16/2018 11:23 AM

## 2018-01-17 ENCOUNTER — Ambulatory Visit (HOSPITAL_COMMUNITY)
Admission: RE | Admit: 2018-01-17 | Payer: BLUE CROSS/BLUE SHIELD | Source: Ambulatory Visit | Admitting: Cardiovascular Disease

## 2018-01-17 ENCOUNTER — Encounter (HOSPITAL_COMMUNITY): Payer: Self-pay | Admitting: *Deleted

## 2018-01-17 ENCOUNTER — Encounter (HOSPITAL_COMMUNITY)
Admission: EM | Disposition: A | Discharge status: Home / Self Care | Payer: Self-pay | Source: Home / Self Care | Attending: Internal Medicine

## 2018-01-17 ENCOUNTER — Inpatient Hospital Stay (HOSPITAL_COMMUNITY): Payer: BLUE CROSS/BLUE SHIELD | Admitting: Anesthesiology

## 2018-01-17 ENCOUNTER — Inpatient Hospital Stay (HOSPITAL_COMMUNITY)
Admission: EM | Admit: 2018-01-17 | Discharge: 2018-01-17 | Disposition: A | Discharge status: Home / Self Care | Payer: BLUE CROSS/BLUE SHIELD | Source: Home / Self Care | Attending: Cardiology | Admitting: Cardiology

## 2018-01-17 DIAGNOSIS — E785 Hyperlipidemia, unspecified: Secondary | ICD-10-CM

## 2018-01-17 DIAGNOSIS — I4892 Unspecified atrial flutter: Secondary | ICD-10-CM

## 2018-01-17 DIAGNOSIS — I340 Nonrheumatic mitral (valve) insufficiency: Secondary | ICD-10-CM

## 2018-01-17 DIAGNOSIS — I429 Cardiomyopathy, unspecified: Secondary | ICD-10-CM

## 2018-01-17 HISTORY — PX: CARDIOVERSION: SHX1299

## 2018-01-17 HISTORY — PX: TEE WITHOUT CARDIOVERSION: SHX5443

## 2018-01-17 LAB — CBC
HEMATOCRIT: 42.6 % (ref 39.0–52.0)
HEMOGLOBIN: 13.8 g/dL (ref 13.0–17.0)
MCH: 27.5 pg (ref 26.0–34.0)
MCHC: 32.4 g/dL (ref 30.0–36.0)
MCV: 84.9 fL (ref 78.0–100.0)
Platelets: 220 10*3/uL (ref 150–400)
RBC: 5.02 MIL/uL (ref 4.22–5.81)
RDW: 14 % (ref 11.5–15.5)
WBC: 6.5 10*3/uL (ref 4.0–10.5)

## 2018-01-17 LAB — BASIC METABOLIC PANEL
ANION GAP: 9 (ref 5–15)
BUN: 13 mg/dL (ref 6–20)
CHLORIDE: 109 mmol/L (ref 101–111)
CO2: 22 mmol/L (ref 22–32)
Calcium: 9.3 mg/dL (ref 8.9–10.3)
Creatinine, Ser: 0.8 mg/dL (ref 0.61–1.24)
GFR calc Af Amer: >60 mL/min (ref >60)
GLUCOSE: 97 mg/dL (ref 65–99)
POTASSIUM: 4.2 mmol/L (ref 3.5–5.1)
Sodium: 140 mmol/L (ref 135–145)

## 2018-01-17 SURGERY — CARDIOVERSION
Anesthesia: General

## 2018-01-17 MED ORDER — PROPOFOL 500 MG/50ML IV EMUL
INTRAVENOUS | Status: DC | PRN
  Administered 2018-01-17: 150 ug/kg/min via INTRAVENOUS

## 2018-01-17 MED ORDER — SODIUM CHLORIDE 0.9 % IV SOLN: INTRAVENOUS | Status: DC

## 2018-01-17 MED ORDER — ONDANSETRON HCL 4 MG/2ML IJ SOLN
INTRAMUSCULAR | Status: DC | PRN
  Administered 2018-01-17: 4 mg via INTRAVENOUS

## 2018-01-17 MED ORDER — SODIUM CHLORIDE 0.9 % IV SOLN
INTRAVENOUS | Status: DC | PRN
  Administered 2018-01-17: 12:00:00 via INTRAVENOUS

## 2018-01-17 MED ORDER — PHENYLEPHRINE 40 MCG/ML (10ML) SYRINGE FOR IV PUSH (FOR BLOOD PRESSURE SUPPORT)
PREFILLED_SYRINGE | INTRAVENOUS | Status: DC | PRN
  Administered 2018-01-17: 80 ug via INTRAVENOUS

## 2018-01-17 MED ORDER — PROPOFOL 10 MG/ML IV BOLUS
INTRAVENOUS | Status: DC | PRN
  Administered 2018-01-17 (×5): 10 mg via INTRAVENOUS

## 2018-01-17 MED ORDER — METOPROLOL TARTRATE 5 MG/5ML IV SOLN
INTRAVENOUS | Status: AC
  Filled 2018-01-17: qty 5

## 2018-01-17 MED ORDER — BUTAMBEN-TETRACAINE-BENZOCAINE 2-2-14 % EX AERO
INHALATION_SPRAY | CUTANEOUS | Status: DC | PRN
  Administered 2018-01-17: 2 via TOPICAL

## 2018-01-17 MED ORDER — METOPROLOL TARTRATE 5 MG/5ML IV SOLN
INTRAVENOUS | Status: DC | PRN
  Administered 2018-01-17: 5 mg via INTRAVENOUS

## 2018-01-17 MED ORDER — SODIUM CHLORIDE 0.9 % IV SOLN
INTRAVENOUS | Status: DC
  Administered 2018-01-17: 12:00:00 via INTRAVENOUS

## 2018-01-17 NOTE — Interval H&P Note (Signed)
History and Physical Interval Note:  01/17/2018 11:36 AM  Logan Meadows  has presented today for surgery, with the diagnosis of afib  The various methods of treatment have been discussed with the patient and family. After consideration of risks, benefits and other options for treatment, the patient has consented to  Procedure(s): CARDIOVERSION (N/A) TRANSESOPHAGEAL ECHOCARDIOGRAM (TEE) (N/A) as a surgical intervention .  The patient's history has been reviewed, patient examined, no change in status, stable for surgery.  I have reviewed the patient's chart and labs.  Questions were answered to the patient's satisfaction.     Charlton Haws

## 2018-01-17 NOTE — H&P (View-Only) (Signed)
Progress Note  Patient Name: Logan Meadows Date of Encounter: 01/17/2018  Primary Cardiologist: Charlton Haws, MD   Subjective   Doing ok. No complaints. Asymptomatic. No events overnight. [facility] later today.   Inpatient Medications    Scheduled Meds: . apixaban  5 mg Oral BID  . carvedilol  12.5 mg Oral BID WC  . fluticasone  2 spray Each Nare Daily  . folic acid  1 mg Oral Daily  . furosemide  40 mg Oral Daily  . loratadine  10 mg Oral Daily  . methimazole  10 mg Oral BID  . multivitamin with minerals  1 tablet Oral Daily  . sodium chloride flush  3 mL Intravenous Q12H  . thiamine  100 mg Oral Daily   Or  . thiamine  100 mg Intravenous Daily   Continuous Infusions: . sodium chloride     PRN Meds: acetaminophen **OR** acetaminophen, levalbuterol **AND** ipratropium, ondansetron **OR** ondansetron (ZOFRAN) IV, sodium chloride flush   Vital Signs    Vitals:   01/17/18 0300 01/17/18 0325 01/17/18 0519 01/17/18 0715  BP: (!) 90/57  (!) 89/50 94/73  Pulse: (!) 131  (!) 131 (!) 133  Resp: (!) 27  (!) 9 (!) 21  Temp:  97.9 F (36.6 C)    TempSrc:  Oral    SpO2: 100%  97% 99%  Weight:      Height:        Intake/Output Summary (Last 24 hours) at 01/17/2018 0802 Last data filed at 01/17/2018 0300 Gross per 24 hour  Intake 3 ml  Output 3455 ml  Net -3452 ml   Filed Weights   01/14/18 0500 01/15/18 0500 01/16/18 0700  Weight: 241 lb 13.5 oz (109.7 kg) 232 lb 5.8 oz (105.4 kg) 235 lb 7.2 oz (106.8 kg)    Telemetry    Atrial flutter w/ RVR in the 130s - Personally Reviewed  ECG    Atrial flutter w/ 2:1 conduction, 132 bpm - Personally Reviewed  Physical Exam   GEN: No acute distress.  Young male.  Neck: No JVD Cardiac: irreguar rhythm, tachy rate, no murmurs, rubs, or gallops.  Respiratory: Clear to auscultation bilaterally. GI: Soft, nontender, non-distended  MS: No edema; No deformity. Neuro:  Nonfocal    Psych: Normal affect   Labs    Chemistry Recent Labs  Lab 01/10/18 1308 01/11/18 0216  01/15/18 0323 01/16/18 0745 01/17/18 0321  NA 143 142   < > 141 139 140  K 3.8 3.7   < > 4.4 4.4 4.2  CL 113* 111   < > 111 108 109  CO2 19* 20*   < > 22 22 22   GLUCOSE 92 110*   < > 103* 98 97  BUN 16 12   < > 14 13 13   CREATININE 0.89 0.84   < > 0.84 0.74 0.80  CALCIUM 9.0 8.8*   < > 9.0 9.5 9.3  PROT 6.5 5.9*  --   --   --   --   ALBUMIN 3.6 3.2*  --   --   --   --   AST 24 21  --   --   --   --   ALT 28 27  --   --   --   --   ALKPHOS 93 82  --   --   --   --   BILITOT 1.8* 1.9*  --   --   --   --  GFRNONAA >60 >60   < > >60 >60 >60  GFRAA >60 >60   < > >60 >60 >60  ANIONGAP 11 11   < > 8 9 9    < > = values in this interval not displayed.     Hematology Recent Labs  Lab 01/14/18 0303 01/15/18 0323 01/17/18 0321  WBC 5.8 5.9 6.5  RBC 4.60 4.80 5.02  HGB 12.5* 13.0 13.8  HCT 39.1 40.1 42.6  MCV 85.0 83.5 84.9  MCH 27.2 27.1 27.5  MCHC 32.0 32.4 32.4  RDW 14.5 14.4 14.0  PLT 202 218 220    Cardiac Enzymes Recent Labs  Lab 01/10/18 2049 01/11/18 0216 01/11/18 0806  TROPONINI 0.04* 0.03* 0.03*    Recent Labs  Lab 01/10/18 1312  TROPIPOC 0.02     BNP Recent Labs  Lab 01/11/18 1557 01/13/18 0645  BNP 209.0* 170.8*     DDimer No results for input(s): DDIMER in the last 168 hours.   Radiology    Nm Thyroid Mult Uptake W/imaging  Result Date: 01/15/2018 CLINICAL DATA:  Hyperthyroidism, heart palpitations, weight loss EXAM: THYROID SCAN AND UPTAKE - 4 AND 24 HOURS TECHNIQUE: Following oral administration of I-123 capsule, anterior planar imaging was acquired at 24 hours. Thyroid uptake was calculated with a thyroid probe at 4-6 hours and 24 hours. RADIOPHARMACEUTICALS:  293 uCi I-123 sodium iodide p.o. COMPARISON:  None FINDINGS: Homogeneous tracer distribution throughout both thyroid lobes. No focal areas of increased or decreased tracer localization seen. 4  hour I-123 uptake = 33.2% (normal 5-20%) 24 hour I-123 uptake = 54.1% (normal 10-30%) IMPRESSION: Normal thyroid scan. Elevated 4 hour and 24 hour radio iodine uptakes consistent with hyperthyroidism. Findings consistent with Graves disease. Electronically Signed   By: Ulyses Southward M.D.   On: 01/15/2018 10:19    Cardiac Studies   2D Echo 01/11/18 Study Conclusions  - Left ventricle: The cavity size was moderately dilated. Wall thickness was normal. Systolic function was severely reduced. The estimated ejection fraction was in the range of 25% to 30%. Diffuse hypokinesis. The study is not technically sufficient to allow evaluation of LV diastolic function. Doppler parameters are consistent with high ventricular filling pressure. - Mitral valve: There was mild regurgitation. - Left atrium: The atrium was moderately to severely dilated. - Right ventricle: The cavity size was mildly dilated. - Right atrium: The atrium was mildly dilated. - Pericardium, extracardiac: A small pericardial effusion was identified. There was a left pleural effusion.  Impressions:  - Severe global reduction in LV systolic function; 4 chamber enlargement; mild MR and TR; small pericardial effusion.   Patient Profile     37 y.o.malewith no cardiac hx, no hx DM, HTN, HLD.No regular checkups.He has hx of seasonal allergies (wears a mask to mow) treated w/ OTC rx,tob use, THC and cocaine usenow admitted with hyperthyroid and a flutter with RVR. Echo with EF 25-30%.  Assessment & Plan    1. Persistent Atrial Flutter w/ RVR: in the setting of hyperthyroidism, now on methimazole. Unable to control rate and rhythm with rate control strategy alone. Rates remains high in the 130s but pt asymptomatic.  He is on a BB, but dose titration has been limited by soft BP.  No Cardizem given low EF. No amiodarone due to thyroid disease. Digoxin was added with little improvement, thus discontinued 5/23. We  will attempt rhythm control with cardioversion. Plan for TEE guided DCCV this afternoon at [facility]. Continue Eliquis for a/c. Keep NPO until procedure.  2. Cardiomyopathy: EF 25-30%. Suspect his cardiomyopathy is tachy mediated by rapid atrial flutter. Continue BB therapy with Coreg. BP remains too soft for ACE/ARB/ Entresto initiation at this time. His systolic BP in the low 100s this morning. Hopefully BP will improve if able to cardiovert back in NSR and slow HR. We can further push HF regimen, if BP allows, in the outpatient setting and plan to recheck echo in several weeks to reassess EF. Suspect EF will improve with restoration of SR and with medical therapy.   3. Acute Systolic HF: euvolemic on exam. No dyspnea. His weight is 235 lb. Was 250 on admit.   4. Hyperthyroidism: TSH undetectable and Free T4 4.13 and Free T3 8.8. Radioactive iodine uptake normal, suggesting Grave's disease. He has been started on methimazole. Management per IM. He will need f/u labs as outpatient w/ PCP or endocrinologist. Continue BB for atrial arrhythmias.    For questions or updates, please contact [facility] Please consult [email or web address] for contact info under Cardiology/STEMI.      Signed, Robbie Lis, PA-C  01/17/2018, 8:02 AM    Patient seen, examined. Available data reviewed. Agree with findings, assessment, and plan as outlined by Robbie Lis, PA-C.  On my exam the patient is alert and oriented in no distress, JVP is normal, lungs are clear bilaterally, heart is tachycardic and regular with no murmur or gallop, abdomen is soft and nontender, extremities show trace edema.  Review of telemetry demonstrates persistent atrial flutter with a heart rate of 130 bpm.  Plans for TEE cardioversion today.  Hopefully with restoration of sinus rhythm he will be able to tolerate a low-dose ACE inhibitor on discharge.  To date we have been unable to escalate his heart failure therapy because of low blood  pressure.  He continues on carvedilol alone.  If he is maintaining sinus rhythm tomorrow without complication he could be discharged home with close outpatient cardiology FU.  Tonny Bollman, M.D. 01/17/2018 9:33 AM

## 2018-01-17 NOTE — Anesthesia Preprocedure Evaluation (Addendum)
Anesthesia Evaluation  Patient identified by MRN, date of birth, ID band Patient awake    Reviewed: Allergy & Precautions, NPO status , Patient's Chart, lab work & pertinent test results  Airway Mallampati: II  TM Distance: >3 FB Neck ROM: Full    Dental no notable dental hx. (+) Dental Advisory Given, Teeth Intact   Pulmonary Current Smoker,    Pulmonary exam normal breath sounds clear to auscultation       Cardiovascular negative cardio ROS Normal cardiovascular exam+ dysrhythmias Atrial Fibrillation  Rhythm:Regular Rate:Normal     Neuro/Psych negative neurological ROS  negative psych ROS   GI/Hepatic negative GI ROS, Neg liver ROS,   Endo/Other  negative endocrine ROSHyperthyroidism   Renal/GU negative Renal ROS  negative genitourinary   Musculoskeletal negative musculoskeletal ROS (+)   Abdominal   Peds negative pediatric ROS (+)  Hematology negative hematology ROS (+)   Anesthesia Other Findings   Reproductive/Obstetrics negative OB ROS                            Anesthesia Physical Anesthesia Plan  ASA: II  Anesthesia Plan: General   Post-op Pain Management:    Induction: Intravenous  PONV Risk Score and Plan: 1 and Ondansetron and Treatment may vary due to age or medical condition  Airway Management Planned: Nasal Cannula and Simple Face Mask  Additional Equipment:   Intra-op Plan:   Post-operative Plan:   Informed Consent: I have reviewed the patients History and Physical, chart, labs and discussed the procedure including the risks, benefits and alternatives for the proposed anesthesia with the patient or authorized representative who has indicated his/her understanding and acceptance.   Dental advisory given  Plan Discussed with: CRNA  Anesthesia Plan Comments:         Anesthesia Quick Evaluation

## 2018-01-17 NOTE — Transfer of Care (Signed)
Immediate Anesthesia Transfer of Care Note  Patient: Logan Meadows  Procedure(s) Performed: CARDIOVERSION (N/A ) TRANSESOPHAGEAL ECHOCARDIOGRAM (TEE) (N/A )  Patient Location: Endoscopy Unit  Anesthesia Type:General  Level of Consciousness: awake, alert  and oriented  Airway & Oxygen Therapy: Patient Spontanous Breathing and Patient connected to nasal cannula oxygen  Post-op Assessment: Report given to RN, Post -op Vital signs reviewed and stable and Patient moving all extremities X 4  Post vital signs: Reviewed and stable  Last Vitals:  Vitals Value Taken Time  BP 90/51 01/17/2018 12:51 PM  Temp 36.3 C 01/17/2018 12:50 PM  Pulse 84 01/17/2018 12:53 PM  Resp 18 01/17/2018 12:53 PM  SpO2 100 % 01/17/2018 12:53 PM  Vitals shown include unvalidated device data.  Last Pain:  Vitals:   01/17/18 1250  TempSrc: Oral  PainSc: 0-No pain      Patients Stated Pain Goal: 0 (01/14/18 0725)  Complications: No apparent anesthesia complications

## 2018-01-17 NOTE — Progress Notes (Addendum)
Progress Note  Patient Name: Logan Meadows Date of Encounter: 01/17/2018  Primary Cardiologist: Charlton Haws, MD   Subjective   Doing ok. No complaints. Asymptomatic. No events overnight. Awaiting [facility] for [facility] later today.   Inpatient Medications    Scheduled Meds: . apixaban  5 mg Oral BID  . carvedilol  12.5 mg Oral BID WC  . fluticasone  2 spray Each Nare Daily  . folic acid  1 mg Oral Daily  . furosemide  40 mg Oral Daily  . loratadine  10 mg Oral Daily  . methimazole  10 mg Oral BID  . multivitamin with minerals  1 tablet Oral Daily  . sodium chloride flush  3 mL Intravenous Q12H  . thiamine  100 mg Oral Daily   Or  . thiamine  100 mg Intravenous Daily   Continuous Infusions: . sodium chloride     PRN Meds: acetaminophen **OR** acetaminophen, levalbuterol **AND** ipratropium, ondansetron **OR** ondansetron (ZOFRAN) IV, sodium chloride flush   Vital Signs    Vitals:   01/17/18 0300 01/17/18 0325 01/17/18 0519 01/17/18 0715  BP: (!) 90/57  (!) 89/50 94/73  Pulse: (!) 131  (!) 131 (!) 133  Resp: (!) 27  (!) 9 (!) 21  Temp:  97.9 F (36.6 C)    TempSrc:  Oral    SpO2: 100%  97% 99%  Weight:      Height:        Intake/Output Summary (Last 24 hours) at 01/17/2018 0802 Last data filed at 01/17/2018 0300 Gross per 24 hour  Intake 3 ml  Output 3455 ml  Net -3452 ml   Filed Weights   01/14/18 0500 01/15/18 0500 01/16/18 0700  Weight: 241 lb 13.5 oz (109.7 kg) 232 lb 5.8 oz (105.4 kg) 235 lb 7.2 oz (106.8 kg)    Telemetry    Atrial flutter w/ RVR in the 130s - Personally Reviewed  ECG    Atrial flutter w/ 2:1 conduction, 132 bpm - Personally Reviewed  Physical Exam   GEN: No acute distress.  Young male.  Neck: No JVD Cardiac: irreguar rhythm, tachy rate, no murmurs, rubs, or gallops.  Respiratory: Clear to auscultation bilaterally. GI: Soft, nontender, non-distended  MS: No edema; No deformity. Neuro:  Nonfocal    Psych: Normal affect   Labs    Chemistry Recent Labs  Lab 01/10/18 1308 01/11/18 0216  01/15/18 0323 01/16/18 0745 01/17/18 0321  NA 143 142   < > 141 139 140  K 3.8 3.7   < > 4.4 4.4 4.2  CL 113* 111   < > 111 108 109  CO2 19* 20*   < > 22 22 22   GLUCOSE 92 110*   < > 103* 98 97  BUN 16 12   < > 14 13 13   CREATININE 0.89 0.84   < > 0.84 0.74 0.80  CALCIUM 9.0 8.8*   < > 9.0 9.5 9.3  PROT 6.5 5.9*  --   --   --   --   ALBUMIN 3.6 3.2*  --   --   --   --   AST 24 21  --   --   --   --   ALT 28 27  --   --   --   --   ALKPHOS 93 82  --   --   --   --   BILITOT 1.8* 1.9*  --   --   --   --  GFRNONAA >60 >60   < > >60 >60 >60  GFRAA >60 >60   < > >60 >60 >60  ANIONGAP 11 11   < > 8 9 9    < > = values in this interval not displayed.     Hematology Recent Labs  Lab 01/14/18 0303 01/15/18 0323 01/17/18 0321  WBC 5.8 5.9 6.5  RBC 4.60 4.80 5.02  HGB 12.5* 13.0 13.8  HCT 39.1 40.1 42.6  MCV 85.0 83.5 84.9  MCH 27.2 27.1 27.5  MCHC 32.0 32.4 32.4  RDW 14.5 14.4 14.0  PLT 202 218 220    Cardiac Enzymes Recent Labs  Lab 01/10/18 2049 01/11/18 0216 01/11/18 0806  TROPONINI 0.04* 0.03* 0.03*    Recent Labs  Lab 01/10/18 1312  TROPIPOC 0.02     BNP Recent Labs  Lab 01/11/18 1557 01/13/18 0645  BNP 209.0* 170.8*     DDimer No results for input(s): DDIMER in the last 168 hours.   Radiology    Nm Thyroid Mult Uptake W/imaging  Result Date: 01/15/2018 CLINICAL DATA:  Hyperthyroidism, heart palpitations, weight loss EXAM: THYROID SCAN AND UPTAKE - 4 AND 24 HOURS TECHNIQUE: Following oral administration of I-123 capsule, anterior planar imaging was acquired at 24 hours. Thyroid uptake was calculated with a thyroid probe at 4-6 hours and 24 hours. RADIOPHARMACEUTICALS:  293 uCi I-123 sodium iodide p.o. COMPARISON:  None FINDINGS: Homogeneous tracer distribution throughout both thyroid lobes. No focal areas of increased or decreased tracer localization seen. 4  hour I-123 uptake = 33.2% (normal 5-20%) 24 hour I-123 uptake = 54.1% (normal 10-30%) IMPRESSION: Normal thyroid scan. Elevated 4 hour and 24 hour radio iodine uptakes consistent with hyperthyroidism. Findings consistent with Graves disease. Electronically Signed   By: Ulyses Southward M.D.   On: 01/15/2018 10:19    Cardiac Studies   2D Echo 01/11/18 Study Conclusions  - Left ventricle: The cavity size was moderately dilated. Wall thickness was normal. Systolic function was severely reduced. The estimated ejection fraction was in the range of 25% to 30%. Diffuse hypokinesis. The study is not technically sufficient to allow evaluation of LV diastolic function. Doppler parameters are consistent with high ventricular filling pressure. - Mitral valve: There was mild regurgitation. - Left atrium: The atrium was moderately to severely dilated. - Right ventricle: The cavity size was mildly dilated. - Right atrium: The atrium was mildly dilated. - Pericardium, extracardiac: A small pericardial effusion was identified. There was a left pleural effusion.  Impressions:  - Severe global reduction in LV systolic function; 4 chamber enlargement; mild MR and TR; small pericardial effusion.   Patient Profile     37 y.o.malewith no cardiac hx, no hx DM, HTN, HLD.No regular checkups.He has hx of seasonal allergies (wears a mask to mow) treated w/ OTC rx,tob use, THC and cocaine usenow admitted with hyperthyroid and a flutter with RVR. Echo with EF 25-30%.  Assessment & Plan    1. Persistent Atrial Flutter w/ RVR: in the setting of hyperthyroidism, now on methimazole. Unable to control rate and rhythm with rate control strategy alone. Rates remains high in the 130s but pt asymptomatic.  He is on a BB, but dose titration has been limited by soft BP.  No Cardizem given low EF. No amiodarone due to thyroid disease. Digoxin was added with little improvement, thus discontinued 5/23. We  will attempt rhythm control with cardioversion. Plan for TEE guided DCCV this afternoon at [facility]. Continue Eliquis for a/c. Keep NPO until procedure.  2. Cardiomyopathy: EF 25-30%. Suspect his cardiomyopathy is tachy mediated by rapid atrial flutter. Continue BB therapy with Coreg. BP remains too soft for ACE/ARB/ Entresto initiation at this time. His systolic BP in the low 100s this morning. Hopefully BP will improve if able to cardiovert back in NSR and slow HR. We can further push HF regimen, if BP allows, in the outpatient setting and plan to recheck echo in several weeks to reassess EF. Suspect EF will improve with restoration of SR and with medical therapy.   3. Acute Systolic HF: euvolemic on exam. No dyspnea. His weight is 235 lb. Was 250 on admit.   4. Hyperthyroidism: TSH undetectable and Free T4 4.13 and Free T3 8.8. Radioactive iodine uptake normal, suggesting Grave's disease. He has been started on methimazole. Management per IM. He will need f/u labs as outpatient w/ PCP or endocrinologist. Continue BB for atrial arrhythmias.    For questions or updates, please contact [facility] Please consult [email or web address] for contact info under Cardiology/STEMI.      Signed, Robbie Lis, PA-C  01/17/2018, 8:02 AM    Patient seen, examined. Available data reviewed. Agree with findings, assessment, and plan as outlined by Robbie Lis, PA-C.  On my exam the patient is alert and oriented in no distress, JVP is normal, lungs are clear bilaterally, heart is tachycardic and regular with no murmur or gallop, abdomen is soft and nontender, extremities show trace edema.  Review of telemetry demonstrates persistent atrial flutter with a heart rate of 130 bpm.  Plans for TEE cardioversion today.  Hopefully with restoration of sinus rhythm he will be able to tolerate a low-dose ACE inhibitor on discharge.  To date we have been unable to escalate his heart failure therapy because of low blood  pressure.  He continues on carvedilol alone.  If he is maintaining sinus rhythm tomorrow without complication he could be discharged home with close outpatient cardiology FU.  Tonny Bollman, M.D. 01/17/2018 9:33 AM

## 2018-01-17 NOTE — Anesthesia Procedure Notes (Signed)
Procedure Name: MAC Date/Time: 01/17/2018 12:20 PM Performed by: Harden Mo, CRNA Pre-anesthesia Checklist: Patient identified, Emergency Drugs available, Suction available and Patient being monitored Patient Re-evaluated:Patient Re-evaluated prior to induction Oxygen Delivery Method: Nasal cannula Preoxygenation: Pre-oxygenation with 100% oxygen Induction Type: IV induction Placement Confirmation: positive ETCO2 and breath sounds checked- equal and bilateral Dental Injury: Teeth and Oropharynx as per pre-operative assessment

## 2018-01-17 NOTE — CV Procedure (Signed)
TEE/DCC: Anesthesia Propofol  EF 35-30% diffuse hypokinesis Likely tachy mediated flutter rate 150-165 Mild MR Mild LAE Normal RV No LAA thrombus No PFO No aortic debris Normal AV  DCC x 1 200 J converted to NSR rate 95-100 IV Lopressor 5 mg given to blunt adrenergic tone  May need higher dose oral beta blocker before d/c Given hyperthyroidism  Charlton Haws

## 2018-01-17 NOTE — Progress Notes (Signed)
  Echocardiogram Echocardiogram Transesophageal has been performed.  Delcie Roch 01/17/2018, 12:52 PM

## 2018-01-17 NOTE — Progress Notes (Signed)
[facility] Progress Note  Patient: Logan Meadows ZOX:[id number]   PCP: Patient, No Pcp Per DOB: 12-03-1980   DOA: 01/10/2018   DOS: 01/17/2018   Date of Service: the patient was seen and examined on 01/17/2018  Subjective: No acute complaint,  Brief hospital course: Pt. with PMH of cocaine abuse marijuana abuse; admitted on 01/10/2018, presented with complaint of palpitation, diaphoresis, diarrhea and weight loss, was found to have Graves' disease with hyperthyroidism without thyrotoxicosis.  Also had A.  Flutter. Currently further plan is continue management per cardiology.  Assessment and Plan: 1.  Atrial flutter with RVR. Type of the flutter not defined. Heart rate remains 1 130s. In the setting of hypothyroidism. Patient remains asymptomatic though. Started on Cardizem and Coreg, Cardizem was discontinued due to low EF. Amiodarone is not used due to presence of hypothyroidism. Cardiology was consulted. Started on apixaban for anticoagulation. Scheduled for TEE cardioversion on Friday.  2.  Tachycardia induced cardiomyopathy. Acute systolic CHF. Presented with shortness of breath, echocardiogram was performed, EF is 25 to 30%. Likely from tachycardia due to rapid atrial flutter. Started on Cardizem and Coreg, Cardizem discontinued due to low EF. Now continuing Coreg. Unable to add any other medication due to patient's low blood pressure. Digoxin added. Received IV Lasix, now transition to oral Lasix. Monitor daily weight and in and out.  3.  Hypothyroidism. Graves' disease. TSH is undetectable, free T4 is 4.13, free T3 is 8.8. Radioactive iodine uptake performed, shows evidence of normal study suggesting Graves' disease. We will start the patient on methimazole. Given his moderate to severe symptoms we will start him on 20 mg daily dose in divided doses. Tolerating it very well so far.  Will monitor. Not in thyroid storm, no indication for steroids. Already on  beta-blocker.  4.  Cocaine abuse. Marijuana abuse. Cessation stressed to the patient. Outpatient monitoring. Patient willing to quit using cocaine.  5.  Loose BM. No diarrhea. Patient reports this is present before admission. Secondary to hyperthyroidism. Should improve once hyperthyroidism get better. Monitor.  Diet: Cardiac diet, n.p.o. for TEE. DVT Prophylaxis: on therapeutic anticoagulation.  Advance goals of care discussion: full code  Family Communication: no family was present at bedside, at the time of interview.   Disposition:  Discharge to home.  Consultants: cardiology  Procedures: Echocardiogram   Antibiotics: Anti-infectives (From admission, onward)   None       Objective: Physical Exam: Vitals:   01/17/18 0325 01/17/18 0519 01/17/18 0715 01/17/18 0800  BP:  (!) 89/50 94/73   Pulse:  (!) 131 (!) 133   Resp:  (!) 9 (!) 21   Temp: 97.9 F (36.6 C)   98.5 F (36.9 C)  TempSrc: Oral   Oral  SpO2:  97% 99%   Weight:    103.7 kg (228 lb 9.9 oz)  Height:        Intake/Output Summary (Last 24 hours) at 01/17/2018 0926 Last data filed at 01/17/2018 0730 Gross per 24 hour  Intake 3 ml  Output 3955 ml  Net -3952 ml   Filed Weights   01/15/18 0500 01/16/18 0700 01/17/18 0800  Weight: 105.4 kg (232 lb 5.8 oz) 106.8 kg (235 lb 7.2 oz) 103.7 kg (228 lb 9.9 oz)   General: Alert, Awake and Oriented to Time, Place and Person. Appear in mild distress, affect flat Eyes: PERRL, Conjunctiva normal ENT: Oral Mucosa clear moist. Neck: no JVD, no Abnormal Mass Or lumps Cardiovascular: S1 and S2 Present, no Murmur, Peripheral  Pulses Present Respiratory: normal respiratory effort, Bilateral Air entry equal and Decreased, no use of accessory muscle, Clear to Auscultation, no Crackles, no wheezes Abdomen: Bowel Sound present, Soft and no tenderness, no hernia Skin: no redness, no Rash, no induration Extremities: no Pedal edema, no calf tenderness Neurologic:  Grossly no focal neuro deficit. Bilaterally Equal motor strength  Data Reviewed: CBC: Recent Labs  Lab 01/10/18 1308  01/12/18 0319 01/13/18 0645 01/14/18 0303 01/15/18 0323 01/17/18 0321  WBC 7.8   < > 8.0 7.9 5.8 5.9 6.5  NEUTROABS 4.5  --   --   --   --  3.1  --   HGB 14.2   < > 13.0 14.0 12.5* 13.0 13.8  HCT 43.6   < > 39.3 42.8 39.1 40.1 42.6  MCV 84.3   < > 84.0 84.6 85.0 83.5 84.9  PLT 228   < > 200 222 202 218 220   < > = values in this interval not displayed.   Basic Metabolic Panel: Recent Labs  Lab 01/10/18 1308 01/11/18 0216 01/12/18 0319 01/13/18 0645 01/14/18 0303 01/15/18 0323 01/16/18 0745 01/17/18 0321  NA 143 142 138 141 139 141 139 140  K 3.8 3.7 3.7 3.7 4.1 4.4 4.4 4.2  CL 113* 111 110 110 108 111 108 109  CO2 19* 20* 18* 21* 22 22 22 22   GLUCOSE 92 110* 111* 114* 103* 103* 98 97  BUN 16 12 12 12 14 14 13 13   CREATININE 0.89 0.84 0.82 0.86 0.86 0.84 0.74 0.80  CALCIUM 9.0 8.8* 8.8* 9.2 9.0 9.0 9.5 9.3  MG 1.8 1.7 1.8 1.8 1.9 2.0 1.9  --   PHOS 3.5 3.6  --   --   --   --   --   --     Liver Function Tests: Recent Labs  Lab 01/10/18 1308 01/11/18 0216  AST 24 21  ALT 28 27  ALKPHOS 93 82  BILITOT 1.8* 1.9*  PROT 6.5 5.9*  ALBUMIN 3.6 3.2*   No results for input(s): LIPASE, AMYLASE in the last 168 hours. No results for input(s): AMMONIA in the last 168 hours. Coagulation Profile: Recent Labs  Lab 01/10/18 1308  INR 1.29   Cardiac Enzymes: Recent Labs  Lab 01/10/18 2049 01/11/18 0216 01/11/18 0806  TROPONINI 0.04* 0.03* 0.03*   BNP (last 3 results) No results for input(s): PROBNP in the last 8760 hours. CBG: No results for input(s): GLUCAP in the last 168 hours. Studies: No results found.  Scheduled Meds: . apixaban  5 mg Oral BID  . carvedilol  12.5 mg Oral BID WC  . fluticasone  2 spray Each Nare Daily  . folic acid  1 mg Oral Daily  . furosemide  40 mg Oral Daily  . loratadine  10 mg Oral Daily  . methimazole  10  mg Oral BID  . multivitamin with minerals  1 tablet Oral Daily  . sodium chloride flush  3 mL Intravenous Q12H  . thiamine  100 mg Oral Daily   Or  . thiamine  100 mg Intravenous Daily   Continuous Infusions: . sodium chloride     PRN Meds: acetaminophen **OR** acetaminophen, levalbuterol **AND** ipratropium, ondansetron **OR** ondansetron (ZOFRAN) IV, sodium chloride flush  Time spent: 15 minutes  Author: Lynden Oxford, MD [facility] Pager: [phone] 01/17/2018 9:26 AM  If 7PM-7AM, please contact night-coverage at [email or web address], password [facility]

## 2018-01-18 ENCOUNTER — Encounter (HOSPITAL_COMMUNITY): Payer: Self-pay | Admitting: Cardiovascular Disease

## 2018-01-18 LAB — CBC
HCT: 43 % (ref 39.0–52.0)
Hemoglobin: 14.2 g/dL (ref 13.0–17.0)
MCH: 28.1 pg (ref 26.0–34.0)
MCHC: 33 g/dL (ref 30.0–36.0)
MCV: 85 fL (ref 78.0–100.0)
PLATELETS: 212 10*3/uL (ref 150–400)
RBC: 5.06 MIL/uL (ref 4.22–5.81)
RDW: 13.7 % (ref 11.5–15.5)
WBC: 7.6 10*3/uL (ref 4.0–10.5)

## 2018-01-18 LAB — BASIC METABOLIC PANEL
Anion gap: 11 (ref 5–15)
BUN: 15 mg/dL (ref 6–20)
CALCIUM: 9.4 mg/dL (ref 8.9–10.3)
CO2: 21 mmol/L — AB (ref 22–32)
CREATININE: 0.76 mg/dL (ref 0.61–1.24)
Chloride: 106 mmol/L (ref 101–111)
GFR calc non Af Amer: >60 mL/min (ref >60)
GLUCOSE: 101 mg/dL — AB (ref 65–99)
Potassium: 4.2 mmol/L (ref 3.5–5.1)
Sodium: 138 mmol/L (ref 135–145)

## 2018-01-18 MED ORDER — CARVEDILOL 12.5 MG PO TABS
18.7500 mg | ORAL_TABLET | Freq: Two times a day (BID) | ORAL | Status: DC
  Administered 2018-01-18 – 2018-01-19 (×2): 18.75 mg via ORAL
  Filled 2018-01-18 (×2): qty 1

## 2018-01-18 MED ORDER — SPIRONOLACTONE 12.5 MG HALF TABLET
12.5000 mg | ORAL_TABLET | Freq: Every day | ORAL | Status: DC
  Administered 2018-01-18 – 2018-01-19 (×2): 12.5 mg via ORAL
  Filled 2018-01-18 (×2): qty 1

## 2018-01-18 NOTE — Progress Notes (Signed)
Progress Note  Patient Name: Logan Meadows Date of Encounter: 01/18/2018  Primary Cardiologist: Dr. Charlton Haws  Subjective   No chest pain or palpitations.  No shortness of breath at rest.  Did not ambulate much yesterday after procedure.  Inpatient Medications    Scheduled Meds: . apixaban  5 mg Oral BID  . carvedilol  12.5 mg Oral BID WC  . fluticasone  2 spray Each Nare Daily  . folic acid  1 mg Oral Daily  . furosemide  40 mg Oral Daily  . loratadine  10 mg Oral Daily  . methimazole  10 mg Oral BID  . multivitamin with minerals  1 tablet Oral Daily  . sodium chloride flush  3 mL Intravenous Q12H  . thiamine  100 mg Oral Daily   Or  . thiamine  100 mg Intravenous Daily   Continuous Infusions: . sodium chloride     PRN Meds: acetaminophen **OR** acetaminophen, levalbuterol **AND** ipratropium, ondansetron **OR** ondansetron (ZOFRAN) IV, sodium chloride flush   Vital Signs    Vitals:   01/18/18 0000 01/18/18 0341 01/18/18 0400 01/18/18 0410  BP: (!) 112/46  (!) 112/51   Pulse: 91  87   Resp: 18  17   Temp:  98.5 F (36.9 C)    TempSrc:  Oral    SpO2: 98%  98%   Weight:    224 lb 13.9 oz (102 kg)  Height:        Intake/Output Summary (Last 24 hours) at 01/18/2018 0809 Last data filed at 01/18/2018 0400 Gross per 24 hour  Intake 0 ml  Output 1275 ml  Net -1275 ml   Filed Weights   01/17/18 0800 01/17/18 1139 01/18/18 0410  Weight: 228 lb 9.9 oz (103.7 kg) 228 lb (103.4 kg) 224 lb 13.9 oz (102 kg)    Telemetry    Sinus rhythm.  Personally reviewed.  ECG    Tracing from 01/17/2018 shows sinus rhythm with left atrial enlargement and nonspecific lateral ST-T wave normalities.  Personally reviewed.  Physical Exam   GEN: No acute distress.   Neck: No JVD. Cardiac: RRR, S3, no murmur.  Respiratory: Nonlabored. Clear to auscultation bilaterally. GI: Soft, nontender, bowel sounds present. MS: No edema; No deformity. Neuro:  Nonfocal. Psych:  Alert and oriented x 3. Normal affect.  Labs    Chemistry Recent Labs  Lab 01/16/18 0745 01/17/18 0321 01/18/18 0341  NA 139 140 138  K 4.4 4.2 4.2  CL 108 109 106  CO2 22 22 21*  GLUCOSE 98 97 101*  BUN 13 13 15   CREATININE 0.74 0.80 0.76  CALCIUM 9.5 9.3 9.4  GFRNONAA >60 >60 >60  GFRAA >60 >60 >60  ANIONGAP 9 9 11      Hematology Recent Labs  Lab 01/15/18 0323 01/17/18 0321 01/18/18 0341  WBC 5.9 6.5 7.6  RBC 4.80 5.02 5.06  HGB 13.0 13.8 14.2  HCT 40.1 42.6 43.0  MCV 83.5 84.9 85.0  MCH 27.1 27.5 28.1  MCHC 32.4 32.4 33.0  RDW 14.4 14.0 13.7  PLT 218 220 212    BNP Recent Labs  Lab 01/11/18 1557 01/13/18 0645  BNP 209.0* 170.8*     Radiology    No results found.  Cardiac Studies   TEE guided cardioversion 01/17/2018: Study Conclusions  - Left ventricle: Diffuse hypo kinesis likely tachycardia induced   DCM given rapid flutter and hyperthyroidism. Systolic function   was severely reduced. The estimated ejection fraction was in the  range of 25% to 30%. - Aortic valve: No evidence of vegetation. - Mitral valve: There was mild regurgitation. - Left atrium: The atrium was moderately dilated. No evidence of   thrombus in the atrial cavity or appendage. - Right ventricle: The cavity size was mildly dilated. - Right atrium: The atrium was dilated. - Atrial septum: No defect or patent foramen ovale was identified. - Pulmonic valve: No evidence of vegetation. - Impressions: No LAA thrombus   Anticoagulated     DCC x 1 200J converted from rapid atrial flutter rate 160 to NSR   rate 98   given 5 mg iv Lopressor to blunt adrenergic tone.     No immediate neurologic sequelae  Impressions:  - No LAA thrombus   Anticoagulated     DCC x 1 200J converted from rapid atrial flutter rate 160 to NSR   rate 98   given 5 mg iv Lopressor to blunt adrenergic tone.     No immediate neurologic sequelae  Patient Profile     37 y.o. male with a  history of tobacco use, THC, currently admitted with hyperthyroidism and atrial flutter with RVR.  He underwent TEE guided cardioversion on May 24, also found to have cardiomyopathy with LVEF 25-30% range.  Assessment & Plan    1.  Persistent atrial flutter with RVR status post successful TEE guided cardioversion on May 24.  He remains in sinus rhythm today. CHADSVASC score is 1.  He is on Coreg and Eliquis.  2.  Cardiomyopathy with LVEF 25 to 30% and diffuse hypokinesis.  Possibly tachycardia mediated.  Has had clinical acute systolic heart failure has improved.  Medical therapy has been limited by recent hypotension.  Has had good diuresis during hospital stay on Lasix.  3.  Hyperthyroidism with Graves' disease complicating above picture.  He is on methimazole per primary team.  4.  Substance abuse including marijuana and cocaine.  Plan to further uptitrate Coreg, stop Lasix and initiate Aldactone at 12.5 mg daily.  Current blood pressure limits addition of ARB or Entresto, this can be considered at a later time.  He is stable for transfer to telemetry, would increase activity and watch rhythm another 24 hours.  Signed, Nona Dell, MD  01/18/2018, 8:09 AM

## 2018-01-18 NOTE — Progress Notes (Signed)
[facility] Progress Note  Patient: Logan Meadows VUY:[id number]   PCP: Patient, No Pcp Per DOB: 08/13/1981   DOA: 01/10/2018   DOS: 01/18/2018   Date of Service: the patient was seen and examined on 01/18/2018  Subjective: No acute complaint, feeling better after TEE.  No chest pain or shortness of breath.  Brief hospital course: Pt. with PMH of cocaine abuse marijuana abuse; admitted on 01/10/2018, presented with complaint of palpitation, diaphoresis, diarrhea and weight loss, was found to have Graves' disease with hyperthyroidism without thyrotoxicosis.  Also had A.  Flutter. Currently further plan is continue management per cardiology.  Assessment and Plan: 1.  Atrial flutter with RVR. Type of the flutter not defined. Heart rate remains 1 130s. In the setting of hypothyroidism. Patient remains asymptomatic though. Started on Cardizem and Coreg, Cardizem was discontinued due to low EF. Amiodarone is not used due to presence of hypothyroidism. Cardiology was consulted. Started on apixaban for anticoagulation. Underwent for TEE cardioversion on Friday.  Normal sinus rhythm following that and tolerating it very well.  Cardiology adjusting medication and recommend to monitor on telemetry for next 24 hours.  2.  Tachycardia induced cardiomyopathy. Acute systolic CHF. Presented with shortness of breath, echocardiogram was performed, EF is 25 to 30%. Likely from tachycardia due to rapid atrial flutter. Started on Cardizem and Coreg, Cardizem discontinued due to low EF. Now continuing Coreg. Unable to add any other medication due to patient's low blood pressure. Digoxin added. Received IV Lasix, now transition to oral Lasix. Monitor daily weight and in and out.  3.  Hypothyroidism. Graves' disease. TSH is undetectable, free T4 is 4.13, free T3 is 8.8. Radioactive iodine uptake performed, shows evidence of normal study suggesting Graves' disease. We will start the patient on  methimazole. Given his moderate to severe symptoms we will start him on 20 mg daily dose in divided doses. Tolerating it very well so far.  Will monitor. Not in thyroid storm, no indication for steroids. Already on beta-blocker.  4.  Cocaine abuse. Marijuana abuse. Cessation stressed to the patient. Outpatient monitoring. Patient willing to quit using cocaine.  5.  Loose BM. No diarrhea. Patient reports this is present before admission. Secondary to hyperthyroidism. Should improve once hyperthyroidism get better. Monitor.  Diet: Cardiac diet, n.p.o. for TEE. DVT Prophylaxis: on therapeutic anticoagulation.  Advance goals of care discussion: full code  Family Communication: no family was present at bedside, at the time of interview.   Disposition:  Discharge to home.  No PCP, patient has insurance.  Patient will follow-up with endocrinology as well.  Consultants: cardiology  Procedures: Echocardiogram   Antibiotics: Anti-infectives (From admission, onward)   None       Objective: Physical Exam: Vitals:   01/18/18 0410 01/18/18 0800 01/18/18 1158 01/18/18 1508  BP:  (!) 115/54 117/77 106/61  Pulse:  74 82 84  Resp:   18 16  Temp:  97.7 F (36.5 C) 99.1 F (37.3 C) 98.5 F (36.9 C)  TempSrc:  Oral Oral Oral  SpO2:  98% 96% 100%  Weight: 102 kg (224 lb 13.9 oz)     Height:        Intake/Output Summary (Last 24 hours) at 01/18/2018 1733 Last data filed at 01/18/2018 1500 Gross per 24 hour  Intake 360 ml  Output 1700 ml  Net -1340 ml   Filed Weights   01/17/18 0800 01/17/18 1139 01/18/18 0410  Weight: 103.7 kg (228 lb 9.9 oz) 103.4 kg (228 lb) 102  kg (224 lb 13.9 oz)   General: Alert, Awake and Oriented to Time, Place and Person. Appear in mild distress, affect flat Eyes: PERRL, Conjunctiva normal ENT: Oral Mucosa clear moist. Neck: no JVD, no Abnormal Mass Or lumps Cardiovascular: S1 and S2 Present, no Murmur, Peripheral Pulses Present Respiratory:  normal respiratory effort, Bilateral Air entry equal and Decreased, no use of accessory muscle, Clear to Auscultation, no Crackles, no wheezes Abdomen: Bowel Sound present, Soft and no tenderness, no hernia Skin: no redness, no Rash, no induration Extremities: no Pedal edema, no calf tenderness Neurologic: Grossly no focal neuro deficit. Bilaterally Equal motor strength  Data Reviewed: CBC: Recent Labs  Lab 01/13/18 0645 01/14/18 0303 01/15/18 0323 01/17/18 0321 01/18/18 0341  WBC 7.9 5.8 5.9 6.5 7.6  NEUTROABS  --   --  3.1  --   --   HGB 14.0 12.5* 13.0 13.8 14.2  HCT 42.8 39.1 40.1 42.6 43.0  MCV 84.6 85.0 83.5 84.9 85.0  PLT 222 202 218 220 212   Basic Metabolic Panel: Recent Labs  Lab 01/12/18 0319 01/13/18 0645 01/14/18 0303 01/15/18 0323 01/16/18 0745 01/17/18 0321 01/18/18 0341  NA 138 141 139 141 139 140 138  K 3.7 3.7 4.1 4.4 4.4 4.2 4.2  CL 110 110 108 111 108 109 106  CO2 18* 21* 22 22 22 22  21*  GLUCOSE 111* 114* 103* 103* 98 97 101*  BUN 12 12 14 14 13 13 15   CREATININE 0.82 0.86 0.86 0.84 0.74 0.80 0.76  CALCIUM 8.8* 9.2 9.0 9.0 9.5 9.3 9.4  MG 1.8 1.8 1.9 2.0 1.9  --   --     Liver Function Tests: No results for input(s): AST, ALT, ALKPHOS, BILITOT, PROT, ALBUMIN in the last 168 hours. No results for input(s): LIPASE, AMYLASE in the last 168 hours. No results for input(s): AMMONIA in the last 168 hours. Coagulation Profile: No results for input(s): INR, PROTIME in the last 168 hours. Cardiac Enzymes: No results for input(s): CKTOTAL, CKMB, CKMBINDEX, TROPONINI in the last 168 hours. BNP (last 3 results) No results for input(s): PROBNP in the last 8760 hours. CBG: No results for input(s): GLUCAP in the last 168 hours. Studies: No results found.  Scheduled Meds: . apixaban  5 mg Oral BID  . carvedilol  18.75 mg Oral BID WC  . fluticasone  2 spray Each Nare Daily  . folic acid  1 mg Oral Daily  . loratadine  10 mg Oral Daily  . methimazole   10 mg Oral BID  . multivitamin with minerals  1 tablet Oral Daily  . sodium chloride flush  3 mL Intravenous Q12H  . spironolactone  12.5 mg Oral Daily  . thiamine  100 mg Oral Daily   Or  . thiamine  100 mg Intravenous Daily   Continuous Infusions: . sodium chloride     PRN Meds: acetaminophen **OR** acetaminophen, levalbuterol **AND** ipratropium, ondansetron **OR** ondansetron (ZOFRAN) IV, sodium chloride flush  Time spent:  Author: Lynden Oxford, MD [facility] Pager: [phone] 01/18/2018 5:33 PM  If 7PM-7AM, please contact night-coverage at [email or web address], password [facility]

## 2018-01-18 NOTE — Anesthesia Postprocedure Evaluation (Signed)
Anesthesia Post Note  Patient: Logan Meadows  Procedure(s) Performed: CARDIOVERSION (N/A ) TRANSESOPHAGEAL ECHOCARDIOGRAM (TEE) (N/A )     Patient location during evaluation: PACU Anesthesia Type: General Level of consciousness: awake and alert Pain management: pain level controlled Vital Signs Assessment: post-procedure vital signs reviewed and stable Respiratory status: spontaneous breathing, nonlabored ventilation, respiratory function stable and patient connected to nasal cannula oxygen Cardiovascular status: blood pressure returned to baseline and stable Postop Assessment: no apparent nausea or vomiting Anesthetic complications: no    Last Vitals:  Vitals:   01/18/18 0400 01/18/18 0800  BP: (!) 112/51 (!) 115/54  Pulse: 87 74  Resp: 17   Temp:    SpO2: 98% 98%    Last Pain:  Vitals:   01/18/18 0800  TempSrc:   PainSc: 0-No pain                 Phillips Grout

## 2018-01-19 MED ORDER — SPIRONOLACTONE 25 MG PO TABS: 12.5000 mg | ORAL_TABLET | Freq: Every day | ORAL | 0 refills | Status: DC

## 2018-01-19 MED ORDER — APIXABAN 5 MG PO TABS: 5.0000 mg | ORAL_TABLET | Freq: Two times a day (BID) | ORAL | 3 refills | Status: DC

## 2018-01-19 MED ORDER — METHIMAZOLE 10 MG PO TABS: 10.0000 mg | ORAL_TABLET | Freq: Two times a day (BID) | ORAL | 0 refills | Status: DC

## 2018-01-19 MED ORDER — THIAMINE HCL 100 MG PO TABS: 100.0000 mg | ORAL_TABLET | Freq: Every day | ORAL | 0 refills | Status: DC

## 2018-01-19 MED ORDER — FOLIC ACID 1 MG PO TABS: 1.0000 mg | ORAL_TABLET | Freq: Every day | ORAL | 0 refills | Status: DC

## 2018-01-19 MED ORDER — CARVEDILOL 6.25 MG PO TABS: 18.7500 mg | ORAL_TABLET | Freq: Two times a day (BID) | ORAL | 0 refills | Status: DC

## 2018-01-19 NOTE — Progress Notes (Signed)
Patient discharged home. Stated understanding of all instructions. Denied questions or concerns about instructions. Transferred to lobby by wheelchair.

## 2018-01-19 NOTE — Progress Notes (Signed)
Progress Note  Patient Name: Logan Meadows Date of Encounter: 01/19/2018  Primary Cardiologist: Dr. Charlton Haws  Subjective   Feels better today, no chest pain or palpitations.  Plans to walk in the hall this morning.  Inpatient Medications    Scheduled Meds: . apixaban  5 mg Oral BID  . carvedilol  18.75 mg Oral BID WC  . fluticasone  2 spray Each Nare Daily  . folic acid  1 mg Oral Daily  . loratadine  10 mg Oral Daily  . methimazole  10 mg Oral BID  . multivitamin with minerals  1 tablet Oral Daily  . sodium chloride flush  3 mL Intravenous Q12H  . spironolactone  12.5 mg Oral Daily  . thiamine  100 mg Oral Daily   Or  . thiamine  100 mg Intravenous Daily   Continuous Infusions: . sodium chloride     PRN Meds: acetaminophen **OR** acetaminophen, levalbuterol **AND** ipratropium, ondansetron **OR** ondansetron (ZOFRAN) IV, sodium chloride flush   Vital Signs    Vitals:   01/18/18 1810 01/18/18 2040 01/19/18 0454 01/19/18 0500  BP: 107/67 102/66 (!) 105/56   Pulse: 80 87 77   Resp:  18 18   Temp:  98.5 F (36.9 C) 98.6 F (37 C)   TempSrc:  Oral Oral   SpO2: 100% 97% 95%   Weight:    226 lb 8 oz (102.7 kg)  Height:        Intake/Output Summary (Last 24 hours) at 01/19/2018 0841 Last data filed at 01/18/2018 1500 Gross per 24 hour  Intake 360 ml  Output 625 ml  Net -265 ml   Filed Weights   01/17/18 1139 01/18/18 0410 01/19/18 0500  Weight: 228 lb (103.4 kg) 224 lb 13.9 oz (102 kg) 226 lb 8 oz (102.7 kg)    Telemetry    Sinus rhythm.  Personally reviewed.  Physical Exam   GEN: No acute distress.   Neck: No JVD. Cardiac: RRR, S3, no murmur.  Respiratory: Nonlabored. Clear to auscultation bilaterally. GI: Soft, nontender, bowel sounds present. MS: No edema; No deformity. Neuro:  Nonfocal. Psych: Alert and oriented x 3. Normal affect.  Labs    Chemistry Recent Labs  Lab 01/16/18 0745 01/17/18 0321 01/18/18 0341  NA 139 140 138    K 4.4 4.2 4.2  CL 108 109 106  CO2 22 22 21*  GLUCOSE 98 97 101*  BUN 13 13 15   CREATININE 0.74 0.80 0.76  CALCIUM 9.5 9.3 9.4  GFRNONAA >60 >60 >60  GFRAA >60 >60 >60  ANIONGAP 9 9 11      Hematology Recent Labs  Lab 01/15/18 0323 01/17/18 0321 01/18/18 0341  WBC 5.9 6.5 7.6  RBC 4.80 5.02 5.06  HGB 13.0 13.8 14.2  HCT 40.1 42.6 43.0  MCV 83.5 84.9 85.0  MCH 27.1 27.5 28.1  MCHC 32.4 32.4 33.0  RDW 14.4 14.0 13.7  PLT 218 220 212    BNP Recent Labs  Lab 01/13/18 0645  BNP 170.8*     Radiology    No results found.  Cardiac Studies   TEE guided cardioversion 01/17/2018: Study Conclusions  - Left ventricle: Diffuse hypo kinesis likely tachycardia induced DCM given rapid flutter and hyperthyroidism. Systolic function was severely reduced. The estimated ejection fraction was in the range of 25% to 30%. - Aortic valve: No evidence of vegetation. - Mitral valve: There was mild regurgitation. - Left atrium: The atrium was moderately dilated. No evidence of thrombus  in the atrial cavity or appendage. - Right ventricle: The cavity size was mildly dilated. - Right atrium: The atrium was dilated. - Atrial septum: No defect or patent foramen ovale was identified. - Pulmonic valve: No evidence of vegetation. - Impressions: No LAA thrombus Anticoagulated  DCC x 1 200J converted from rapid atrial flutter rate 160 to NSR rate 98 given 5 mg iv Lopressor to blunt adrenergic tone.  No immediate neurologic sequelae  Impressions:  - No LAA thrombus Anticoagulated  DCC x 1 200J converted from rapid atrial flutter rate 160 to NSR rate 98 given 5 mg iv Lopressor to blunt adrenergic tone.  No immediate neurologic sequelae  Patient Profile     37 y.o. male with a history of tobacco use, THC, currently admitted with hyperthyroidism and atrial flutter with RVR.  He underwent TEE guided cardioversion on May 24, also found to have  cardiomyopathy with LVEF 25-30% range.  Assessment & Plan    1.  Persistent atrial flutter with RVR initially, now status post successful TEE guided cardioversion on May 24.  He is maintaining sinus rhythm.  CHADSVASC score is 1.  2.  Cardiomyopathy with LVEF 25 to 30% and diffuse hypokinesis.  Likely nonischemic and tachycardia-mediated.  Acute systolic heart failure symptoms have improved.  Relatively low blood pressures have limited medication adjustments but he is tolerating the current regimen well.  3.  Hyperthyroidism with Graves' disease complicating the above picture.  He is on methimazole.  4.  History of substance abuse including marijuana and cocaine.  Ambulate in hall, if he does well anticipate discharge home today.  Would continue Eliquis, current doses of Coreg and Aldactone.  He will need to have close office follow-up within a week with Dr. Eden Emms or APP.  Addition of ARB or Entresto can be contemplated as an outpatient depending on blood pressure.  He should have a follow-up BMET for his office visit.  Signed, Nona Dell, MD  01/19/2018, 8:41 AM

## 2018-01-21 NOTE — Discharge Summary (Signed)
[facility] Discharge Summary   Patient: Logan Meadows:[id number]   PCP: Patient, No Pcp Per DOB: May 12, 1981   Date of admission: 01/10/2018   Date of discharge: 01/19/2018    Discharge Diagnoses:  Principal Problem:   Atrial flutter with rapid ventricular response (HCC) Active Problems:   Hyperthyroidism   Hx of cocaine abuse   Atrial fibrillation with RVR (HCC)   Cardiomyopathy (HCC)   Acute systolic (congestive) heart failure (HCC)   Admitted From: home Disposition:  home  Recommendations for Outpatient Follow-up:  1. Please follow-up with endocrinology as recommended. 2. PCP in 1 to 2 weeks.  Cardiology as recommended as well.  Follow-up Information    Talmage Coin, MD. Schedule an appointment as soon as possible for a visit in 2 week(s).   Specialty:  Endocrinology Why:  f/u in 2-3 weeks. Contact information: [address] [place] [address] [phone]        Wendall Stade, MD. Schedule an appointment as soon as possible for a visit in 1 week(s).   Specialty:  Cardiology Contact information: [address] [place] [address] [phone]          Diet recommendation: cardiac diet  Activity: The patient is advised to gradually reintroduce usual activities.  Discharge Condition: good  Code Status: full code  History of present illness: As per the H and P dictated on admission, "Logan Meadows is a 37 y.o. male with medical history significant of allergies hx of Cocaine and Marijuana    Presented with 1 week ago he developed  Palpitations while mowing his lawn associated with Shortness of breath and fatigue.   Patient attempted to rest and drink some water palpitation symptom improved.  The palpitations have resumed every time he exerts himself he noticed also worsening shortness of breath with exertion has been lightheaded when he tries to stand up but did not pass out no leg swelling but has  been short of breath at night because of his difficulties he presented to urgent care today at first he was treated with albuterol for shortness of breath but then noted to be extremely tachycardic he was found to be in atrial fibrillation sent to emergency department on arrival heart rate greater than 170 Reports cocaine was 1 weeks ago no prior history of diabetes hypertension coronary artery disease but does not follow his primary care provider.  Of note reportedly had an episode of syncope 1 month ago after standing up. He reports increased sweating feeling hot, diarrhea, losing weight for  3 weeks. He does not have regular doctor, no hx of thyroid problems in the past.    He reports drinking plenty of water but he has been  Sweating mpre than usual.   Reports drinks 12 pack only on weekends have not had any alcohol for >6 days. "  Hospital Course:  Summary of his active problems in the hospital is as following. 1.Atrial flutter with RVR. Type of the flutter not defined. Heart rate remains 1 130s. In the setting of hypothyroidism. Patient remains asymptomatic though. Started on Cardizem and Coreg, Cardizem was discontinued due to low EF. Amiodarone is not used due to presence of hypothyroidism. Cardiology was consulted. Started on apixaban for anticoagulation. Underwent for TEE cardioversion on Friday.  Normal sinus rhythm following that and tolerating it very well.   2.Tachycardia induced cardiomyopathy. Acute systolic CHF. Presented with shortness of breath, echocardiogram was performed, EF is 25 to 30%.  Likely from tachycardia due to rapid atrial flutter. Started on Cardizem and Coreg, Cardizem discontinued due to low EF. Now continuing Coreg. Unable to add any other medication due to patient's low blood pressure. Digoxin added. Received IV Lasix, now transition to oral Lasix.  3.Hypothyroidism. Graves' disease. TSH is undetectable, free T4 is 4.13, free T3 is  8.8. Radioactive iodine uptake performed, shows evidence of normal study suggesting Graves' disease. We will start the patient on methimazole. Given his moderate to severe symptoms we will start him on 20 mg daily dose in divided doses. Tolerating it very well so far.  Will monitor. Not in thyroid storm, no indication for steroids. Already on beta-blocker.  4.Cocaine abuse. Marijuana abuse. Cessation stressed to the patient. Outpatient monitoring. Patient willing to quit using cocaine.  5.  Loose BM. No diarrhea. Patient reports this is present before admission. Secondary to hyperthyroidism. Should improve once hyperthyroidism get better. Monitor.   All other chronic medical condition were stable during the hospitalization.  Patient was ambulatory without any assistance. On the day of the discharge the patient's vitals were stable , and no other acute medical condition were reported by patient. the patient was felt safe to be discharge at home with family.  Consultants: cardiology  Procedures: Echocardiogram  TEE DCCV  DISCHARGE MEDICATION: Allergies as of 01/19/2018   No Known Allergies     Medication List    TAKE these medications   acetaminophen 500 MG tablet Commonly known as:  TYLENOL Take 500 mg by mouth every 6 (six) hours as needed.   apixaban 5 MG Tabs tablet Commonly known as:  ELIQUIS Take 1 tablet (5 mg total) by mouth 2 (two) times daily.   carvedilol 6.25 MG tablet Commonly known as:  COREG Take 3 tablets (18.75 mg total) by mouth 2 (two) times daily with a meal.   cetirizine 10 MG tablet Commonly known as:  ZYRTEC Take 10 mg by mouth daily.   fluticasone 50 MCG/ACT nasal spray Commonly known as:  FLONASE Place 2 sprays into both nostrils daily as needed for allergies or rhinitis.   folic acid 1 MG tablet Commonly known as:  FOLVITE Take 1 tablet (1 mg total) by mouth daily.   methimazole 10 MG tablet Commonly known as:  TAPAZOLE Take  1 tablet (10 mg total) by mouth 2 (two) times daily.   spironolactone 25 MG tablet Commonly known as:  ALDACTONE Take 0.5 tablets (12.5 mg total) by mouth daily.   thiamine 100 MG tablet Take 1 tablet (100 mg total) by mouth daily.      No Known Allergies Discharge Instructions    Diet - low sodium heart healthy   Complete by:  As directed    Increase activity slowly   Complete by:  As directed      Discharge Exam: Filed Weights   01/17/18 1139 01/18/18 0410 01/19/18 0500  Weight: 103.4 kg (228 lb) 102 kg (224 lb 13.9 oz) 102.7 kg (226 lb 8 oz)   Vitals:   01/19/18 0912 01/19/18 1337  BP: 104/63 98/67  Pulse: 88 79  Resp: 18 16  Temp: 98.2 F (36.8 C) 98.2 F (36.8 C)  SpO2: 100% 100%   General: Appear in no distress, no Rash; Oral Mucosa moist. Cardiovascular: S1 and S2 Present, no Murmur, no JVD Respiratory: Bilateral Air entry present and Clear to Auscultation, no Crackles, no wheezes Abdomen: Bowel Sound present, Soft and no tenderness Extremities: no Pedal edema, no calf tenderness Neurology: Grossly no  focal neuro deficit.no  The results of significant diagnostics from this hospitalization (including imaging, microbiology, ancillary and laboratory) are listed below for reference.    Significant Diagnostic Studies: Nm Thyroid Mult Uptake W/imaging  Result Date: 01/15/2018 CLINICAL DATA:  Hyperthyroidism, heart palpitations, weight loss EXAM: THYROID SCAN AND UPTAKE - 4 AND 24 HOURS TECHNIQUE: Following oral administration of I-123 capsule, anterior planar imaging was acquired at 24 hours. Thyroid uptake was calculated with a thyroid probe at 4-6 hours and 24 hours. RADIOPHARMACEUTICALS:  293 uCi I-123 sodium iodide p.o. COMPARISON:  None FINDINGS: Homogeneous tracer distribution throughout both thyroid lobes. No focal areas of increased or decreased tracer localization seen. 4 hour I-123 uptake = 33.2% (normal 5-20%) 24 hour I-123 uptake = 54.1% (normal 10-30%)  IMPRESSION: Normal thyroid scan. Elevated 4 hour and 24 hour radio iodine uptakes consistent with hyperthyroidism. Findings consistent with Graves disease. Electronically Signed   By: Ulyses Southward M.D.   On: 01/15/2018 10:19   Dg Chest Port 1 View  Result Date: 01/11/2018 CLINICAL DATA:  Shortness of breath and cough EXAM: PORTABLE CHEST 1 VIEW COMPARISON:  Yesterday FINDINGS: Cardiomegaly with probable Kerley lines and possible trace effusions. No air bronchogram or pneumothorax. Stable aortic and hilar contours. IMPRESSION: CHF pattern. Electronically Signed   By: Marnee Spring M.D.   On: 01/11/2018 12:41   Dg Chest Port 1 View  Result Date: 01/10/2018 CLINICAL DATA:  Fluttering sensation in the chest for the past week. EXAM: PORTABLE CHEST 1 VIEW COMPARISON:  None. FINDINGS: Mild basilar atelectasis is seen. Heart size is upper normal. No pneumothorax or pleural effusion. No acute bony abnormality. IMPRESSION: No acute disease. Electronically Signed   By: Drusilla Kanner M.D.   On: 01/10/2018 14:24    Microbiology: No results found for this or any previous visit (from the past 240 hour(s)).   Labs: CBC: Recent Labs  Lab 01/15/18 0323 01/17/18 0321 01/18/18 0341  WBC 5.9 6.5 7.6  NEUTROABS 3.1  --   --   HGB 13.0 13.8 14.2  HCT 40.1 42.6 43.0  MCV 83.5 84.9 85.0  PLT 218 220 212   Basic Metabolic Panel: Recent Labs  Lab 01/15/18 0323 01/16/18 0745 01/17/18 0321 01/18/18 0341  NA 141 139 140 138  K 4.4 4.4 4.2 4.2  CL 111 108 109 106  CO2 22 22 22  21*  GLUCOSE 103* 98 97 101*  BUN 14 13 13 15   CREATININE 0.84 0.74 0.80 0.76  CALCIUM 9.0 9.5 9.3 9.4  MG 2.0 1.9  --   --    Liver Function Tests: No results for input(s): AST, ALT, ALKPHOS, BILITOT, PROT, ALBUMIN in the last 168 hours. No results for input(s): LIPASE, AMYLASE in the last 168 hours. No results for input(s): AMMONIA in the last 168 hours. Cardiac Enzymes: No results for input(s): CKTOTAL, CKMB,  CKMBINDEX, TROPONINI in the last 168 hours. BNP (last 3 results) Recent Labs    01/11/18 1557 01/13/18 0645  BNP 209.0* 170.8*   CBG: No results for input(s): GLUCAP in the last 168 hours. Time spent: 35 minutes  Signed:  Lynden Oxford  [facility] 01/19/2018   , 6:19 PM

## 2018-01-28 ENCOUNTER — Encounter: Payer: Self-pay | Admitting: Physician Assistant

## 2018-01-28 NOTE — Progress Notes (Addendum)
Cardiology Office Note    Date:  01/29/2018  ID:  [name], DOB [birth year], MRN [id number] PCP:  Patient, No Pcp Per  Cardiologist:  Charlton Haws, MD   Chief Complaint: f/u atrial flutter  History of Present Illness:  Logan Meadows is a 37 y.o. male with history of seasonal allergies, tobacco abuse, THC use, prior cocaine use, recently diagnosed atrial flutter/cardiomyopathy/hyperthyroidism who presents for post-hospital follow-up.   He presented to the hospital recently 12/2017 with palpitations and shortness of breath. He reported drinking and using cocaine on the weekends. He had lost 40lb in 6 months' time. He had felt poorly for approximately a week. Upon arrival he was found to be in atrial flutter with RVR. He did note occasional palpitations but was not particularly aware of his RVR when he was hooked up to the monitor. He had also recently noted orthostatic type symptoms. UDS was + THC, negative for cocaine. 2D echo 01/11/18 showed EF 25-30%, high ventricular filling pressure, mild MR, mod-severely dilated LA, mildly dilated RV/RA, small perciardial effusion. He was found to have hyperthyroidism with undetectable TSH and elevated free T4. Radioactive iodine uptake performed suggesting Graves' disease. He was started on methimazole and beta blocker. Cardizem had been initially started for heart rate control but was stopped due to low EF. Amiodarone was avoided given his thyroid disease.  He underwent TEE 01/17/18 showing EF 30-35% with diffuse HK, mild MR, mild LAE, normal RV, no LAA thrombus or PFO -> received cardioversion to NSR HR 95-100. From a cardiac medication perspective he was discharged on carvedilol, Eliquis and spironolactone. Last labs showed normal CBC, K 4.2, Cr 0.76, Mg 1.7-1.9. BNP 209, A1c 5.4, LDL 67, troponin 0.04 peak.  He returns for follow-up overall feeling tremendously better. He feels even better now than when he got out of the hospital. He resumed  working in a warehouse. His job is moderately physical but not strenously so. He misunderstood the carvedilol instructions and has been taking 3 tablets total daily - 2 in the AM, 1 in the PM. He denies any recurrent palpitations. He's not had any chest pain, dyspnea, edema, orthopnea. He says he spoke with endocrinology office for follow-up of his thyroid issue and says it sounded like they wanted him to get a primary care provider first. He is currently NYHA class 1.   Past Medical History:  Diagnosis Date  . Abnormal TSH 01/10/2018  . Alcohol use   . Atrial flutter (HCC) 01/10/2018   a. dx 12/2017 in setting of hyperthyroidism, cocaine, alcohol, THC use.  . Cardiomyopathy (HCC)    a. suspected tachy-mediated - EF 20-25% by echo and 30-35% by TEE 12/2017.  Marland Kitchen Cocaine use   . Graves disease   . Hyperthyroidism   . Marijuana use   . Seasonal allergies     Past Surgical History:  Procedure Laterality Date  . CARDIOVERSION N/A 01/17/2018   Procedure: CARDIOVERSION;  Surgeon: Wendall Stade, MD;  Location[facility] ENDOSCOPY;  Service: Cardiovascular;  Laterality: N/A;  . None    . TEE WITHOUT CARDIOVERSION N/A 01/17/2018   Procedure: TRANSESOPHAGEAL ECHOCARDIOGRAM (TEE);  Surgeon: Wendall Stade, MD;  Location: [facility] ENDOSCOPY;  Service: Cardiovascular;  Laterality: N/A;    Current Medications: Current Meds  Medication Sig  . acetaminophen (TYLENOL) 500 MG tablet Take 500 mg by mouth every 6 (six) hours as needed.  Marland Kitchen apixaban (ELIQUIS) 5 MG TABS tablet Take 1 tablet (5 mg total) by mouth 2 (two)  times daily.  . carvedilol (COREG) 12.5 MG tablet Take 1 tablet (12.5 mg total) by mouth 2 (two) times daily with a meal.  . cetirizine (ZYRTEC) 10 MG tablet Take 10 mg by mouth daily.  . fluticasone (FLONASE) 50 MCG/ACT nasal spray Place 2 sprays into both nostrils daily as needed for allergies or rhinitis.  . folic acid (FOLVITE) 1 MG tablet Take 1 tablet (1 mg total) by mouth daily.  . methimazole  (TAPAZOLE) 10 MG tablet Take 1 tablet (10 mg total) by mouth 2 (two) times daily.  Marland Kitchen spironolactone (ALDACTONE) 25 MG tablet Take 0.5 tablets (12.5 mg total) by mouth daily.  Marland Kitchen thiamine 100 MG tablet Take 1 tablet (100 mg total) by mouth daily.  . [DISCONTINUED] carvedilol (COREG) 6.25 MG tablet Take 3 tablets (18.75 mg total) by mouth 2 (two) times daily with a meal.    Allergies:   Patient has no known allergies.   Social History   Socioeconomic History  . Marital status: Single    Spouse name: Not on file  . Number of children: Not on file  . Years of education: Not on file  . Highest education level: Not on file  Occupational History  . Occupation: Sports administrator person    Employer: [employer]  Social Needs  . Financial resource strain: Not on file  . Food insecurity:    Worry: Not on file    Inability: Not on file  . Transportation needs:    Medical: Not on file    Non-medical: Not on file  Tobacco Use  . Smoking status: Current Every Day Smoker    Packs/day: 0.25  . Smokeless tobacco: Never Used  . Tobacco comment: 1 pack on the weekends  Substance and Sexual Activity  . Alcohol use: Yes    Alcohol/week: 7.2 oz    Types: 12 Cans of beer per week    Comment: 12 pack per weekend  . Drug use: Yes    Frequency: 7.0 times per week    Types: Marijuana, Cocaine    Comment: daily marijuana, cocaine on weekends  . Sexual activity: Not on file  Lifestyle  . Physical activity:    Days per week: Not on file    Minutes per session: Not on file  . Stress: Not on file  Relationships  . Social connections:    Talks on phone: Not on file    Gets together: Not on file    Attends religious service: Not on file    Active member of club or organization: Not on file    Attends meetings of clubs or organizations: Not on file    Relationship status: Not on file  Other Topics Concern  . Not on file  Social History Narrative   Pt lives with his mother.      Family History:  The  patient's family history includes Colon polyps in his father; Diabetes in his father and mother.  ROS:   Please see the history of present illness. All other systems are reviewed and otherwise negative.    PHYSICAL EXAM:   VS:  BP 124/78   Pulse 62   Ht 6\' 2"  (1.88 m)   Wt 231 lb (104.8 kg)   SpO2 99%   BMI 29.66 kg/m   BMI: Body mass index is 29.66 kg/m. GEN: Well nourished, well developed AAM, in no acute distress HEENT: normocephalic, atraumatic Neck: no JVD, carotid bruits, or masses Cardiac:RRR; no murmurs, rubs, or gallops, no edema  Respiratory:  clear to auscultation bilaterally, normal work of breathing GI: soft, nontender, nondistended, + BS MS: no deformity or atrophy Skin: warm and dry, no rash Neuro:  Alert and Oriented x 3, Strength and sensation are intact, follows commands Psych: euthymic mood, full affect  Wt Readings from Last 3 Encounters:  01/29/18 231 lb (104.8 kg)  01/19/18 226 lb 8 oz (102.7 kg)      Studies/Labs Reviewed:   EKG:  EKG was ordered today and personally reviewed by me and demonstrates NSR 61bpm no acute ST-T changes  Recent Labs: 01/11/2018: ALT 27; TSH <0.010 01/13/2018: B Natriuretic Peptide 170.8 01/16/2018: Magnesium 1.9 01/18/2018: BUN 15; Creatinine, Ser 0.76; Hemoglobin 14.2; Platelets 212; Potassium 4.2; Sodium 138   Lipid Panel    Component Value Date/Time   CHOL 108 01/12/2018 0319   TRIG 47 01/12/2018 0319   HDL 32 (L) 01/12/2018 0319   CHOLHDL 3.4 01/12/2018 0319   VLDL 9 01/12/2018 0319   LDLCALC 67 01/12/2018 0319    Additional studies/ records that were reviewed today include: Summarized above    ASSESSMENT & PLAN:   1. Atrial flutter - suspect related to hyperthyroidism +/- substance abuse. Maintaining NSR. Continue Eliquis. F/u CBC today. I will reach out to Dr. Eden Emms to clarify the duration of Eliquis. CHADSVASC is currently 1 for LV dysfunction. He may be at increased risk in the post-hospital period in  which he is gaining control of his hyperthyroidism but for now symptoms are quiescent. Addendum - Per Dr. Eden Emms - "Would continue anti coagulation for at least 3 months after euthyroid " 2. Cardiomyopathy - appears euvolemic. Weight is up several lb but I suspect r/t treatment of #3 (had previously lost 40lb prior to admission). He is NYHA class I therefore instead of Entresto, will start losartan 25mg  daily. Check BMET today. As above he was taking carvedilol incorrectly (12.5mg  QAM/6.25mg  QPM) - will increase him to carvedilol 12.5mg  BID. We discussed using up his 6.25mg  tablets, taking 2 tablets twice a day, then when filling new prescription, taking 1 tablet twice a day. Check BMET. Long talk about his cardiomyopathy today. Reviewed 2g sodium restriction, 2L fluid restriction, daily weights with patient. Will have him f/u in 4-6 weeks at which time we can discuss obtaining repeat echocardiogram. 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 He is not describing any anginal symptoms. If LVEF remains depressed need to consider ischemic eval but hopeful this will improve with quiescence of thyroid and arrhythmia issues. 3. Hyperthyroidism - clinically appears to be improving on methimazole, given that HR has improved even though patient accidentally decreased his beta blocker dose. Gave him [facility] hotline number to obtain PCP - I am hopeful he can see them soon as requested by endocrinology. 4. Polysubstance abuse - he states he does not ever want to feel that bad again and is extremely motivated to avoid drugs and alcohol. We discussed strict avoidance of cocaine with beta blocker therapy.  Disposition: F/u with myself or Dr. Neldon Newport team APP in 4-6 weeks.  Medication Adjustments/Labs and Tests Ordered: Current medicines are reviewed at length with the patient today.  Concerns regarding medicines are outlined above. Medication changes, Labs and Tests ordered today are summarized above  and listed in the Patient Instructions accessible in Encounters.   Signed, Laurann Montana, PA-C  01/29/2018 12:26 PM    [facility] [address] [place], [place], [place]  [address] Phone: [phone]; Fax: [phone]

## 2018-01-29 ENCOUNTER — Ambulatory Visit (INDEPENDENT_AMBULATORY_CARE_PROVIDER_SITE_OTHER): Payer: BLUE CROSS/BLUE SHIELD | Admitting: Physician Assistant

## 2018-01-29 ENCOUNTER — Encounter: Payer: Self-pay | Admitting: Physician Assistant

## 2018-01-29 VITALS — BP 124/78 | HR 62 | Ht 74.0 in | Wt 231.0 lb

## 2018-01-29 DIAGNOSIS — I4892 Unspecified atrial flutter: Secondary | ICD-10-CM | POA: Diagnosis not present

## 2018-01-29 DIAGNOSIS — I429 Cardiomyopathy, unspecified: Secondary | ICD-10-CM

## 2018-01-29 DIAGNOSIS — E0590 Thyrotoxicosis, unspecified without thyrotoxic crisis or storm: Secondary | ICD-10-CM | POA: Diagnosis not present

## 2018-01-29 DIAGNOSIS — F1910 Other psychoactive substance abuse, uncomplicated: Secondary | ICD-10-CM

## 2018-01-29 MED ORDER — LOSARTAN POTASSIUM 25 MG PO TABS: 25.0000 mg | ORAL_TABLET | Freq: Every day | ORAL | 3 refills | Status: DC

## 2018-01-29 MED ORDER — CARVEDILOL 12.5 MG PO TABS: 12.5000 mg | ORAL_TABLET | Freq: Two times a day (BID) | ORAL | 3 refills | Status: DC

## 2018-01-29 NOTE — Patient Instructions (Addendum)
Medication Instructions:Your physician has recommended you make the following change in your medication: Use up your current carvedilol tablets (the 6.25mg  dose) by taking 2 tablets twice a day. When this runs out, please go fill the new prescription for 12.5mg  taking 1 tablet twice a day.   Labwork: TODAY: BMET,CBC  Procedures/Testing:   Follow-Up: Your physician recommends that you schedule a follow-up appointment in: 4-6 weeks with Ronie Spies PA-C or Dr.Nishan/someone on his care team   Any Additional Special Instructions Will Be Listed Below (If Applicable).  For patients with weak heart muscles, we give them these special instructions:  1. Follow a low-salt diet - you are allowed no more than 2,000mg  of sodium per day. Watch your fluid intake. In general, you should not be taking in more than 2 liters of fluid per day (which is about 64 oz per day). This includes sources of water in foods like soup, coffee, tea, milk, etc. 2. Weigh yourself on the same scale at same time of day and keep a log. 3. Call your doctor: (Anytime you feel any of the following symptoms)  - 3lb weight gain overnight or 5lb within a few days - Shortness of breath, with or without a dry hacking cough  - Swelling in the hands, feet or stomach  - If you have to sleep on extra pillows at night in order to breathe   IT IS IMPORTANT TO LET YOUR DOCTOR KNOW EARLY ON IF YOU ARE HAVING SYMPTOMS SO WE CAN HELP YOU!     If you need a refill on your cardiac medications before your next appointment, please call your pharmacy.

## 2018-01-30 LAB — CBC
HEMATOCRIT: 44.5 % (ref 37.5–51.0)
Hemoglobin: 14.6 g/dL (ref 13.0–17.7)
MCH: 27.3 pg (ref 26.6–33.0)
MCHC: 32.8 g/dL (ref 31.5–35.7)
MCV: 83 fL (ref 79–97)
Platelets: 230 10*3/uL (ref 150–450)
RBC: 5.34 x10E6/uL (ref 4.14–5.80)
RDW: 14.3 % (ref 12.3–15.4)
WBC: 5.1 10*3/uL (ref 3.4–10.8)

## 2018-01-30 LAB — BASIC METABOLIC PANEL
BUN / CREAT RATIO: 18 (ref 9–20)
BUN: 12 mg/dL (ref 6–20)
CALCIUM: 9.7 mg/dL (ref 8.7–10.2)
CHLORIDE: 106 mmol/L (ref 96–106)
CO2: 22 mmol/L (ref 20–29)
CREATININE: 0.68 mg/dL — AB (ref 0.76–1.27)
GFR calc Af Amer: 142 mL/min/{1.73_m2} (ref >59)
GFR calc non Af Amer: 123 mL/min/{1.73_m2} (ref >59)
GLUCOSE: 87 mg/dL (ref 65–99)
Potassium: 4.8 mmol/L (ref 3.5–5.2)
Sodium: 142 mmol/L (ref 134–144)

## 2018-02-05 ENCOUNTER — Other Ambulatory Visit: Payer: BLUE CROSS/BLUE SHIELD

## 2018-02-05 ENCOUNTER — Telehealth: Payer: Self-pay | Admitting: Physician Assistant

## 2018-02-05 ENCOUNTER — Encounter: Payer: Self-pay | Admitting: *Deleted

## 2018-02-05 DIAGNOSIS — Z79899 Other long term (current) drug therapy: Secondary | ICD-10-CM

## 2018-02-05 NOTE — Telephone Encounter (Signed)
Follow Up:    Returning your call from today, concerning his lab results. 

## 2018-02-05 NOTE — Telephone Encounter (Signed)
Returned pts call and discussed his lab results from o/v 01/29/18. Pt will try to come by the office today for repeat BMET

## 2018-02-06 LAB — BASIC METABOLIC PANEL
BUN/Creatinine Ratio: 20 (ref 9–20)
BUN: 15 mg/dL (ref 6–20)
CALCIUM: 9.3 mg/dL (ref 8.7–10.2)
CHLORIDE: 109 mmol/L — AB (ref 96–106)
CO2: 22 mmol/L (ref 20–29)
Creatinine, Ser: 0.74 mg/dL — ABNORMAL LOW (ref 0.76–1.27)
GFR calc non Af Amer: 119 mL/min/{1.73_m2} (ref >59)
GFR, EST AFRICAN AMERICAN: 137 mL/min/{1.73_m2} (ref >59)
GLUCOSE: 78 mg/dL (ref 65–99)
Potassium: 4.6 mmol/L (ref 3.5–5.2)
Sodium: 141 mmol/L (ref 134–144)

## 2018-02-13 NOTE — Progress Notes (Signed)
Pt has been made aware of normal result and verbalized understanding.  jw 02/13/18

## 2018-02-20 ENCOUNTER — Other Ambulatory Visit: Payer: Self-pay | Admitting: *Deleted

## 2018-02-20 MED ORDER — METHIMAZOLE 10 MG PO TABS: 10.0000 mg | ORAL_TABLET | Freq: Two times a day (BID) | ORAL | 0 refills | Status: DC

## 2018-02-20 MED ORDER — FOLIC ACID 1 MG PO TABS: 1.0000 mg | ORAL_TABLET | Freq: Every day | ORAL | 0 refills | Status: DC

## 2018-02-20 NOTE — Telephone Encounter (Signed)
Patient called to request refills on methimazole and folic acid. I made him aware that these should be refilled by his pcp, but he stated that he doesn't have one and he wasn't made aware that cardiology would not refill these. He told me that he was given a number to call to obtain a pcp but he hasn't called yet. He is going to call now and try to schedule an appointment but would like to know if these could be refilled in the interim. Please advise. Thanks, MI

## 2018-02-20 NOTE — Telephone Encounter (Signed)
Ok to refill for 14 days but we did have conversation of importance of PCP follow-up at 6/5 visit. We cannot refill beyond then because we are not actively following these things. Can also give him number of [facility] who schedule same-day urgent care/establish care visits [phone] or [facility] walk-in clinic.

## 2018-02-20 NOTE — Telephone Encounter (Signed)
Spoke with patient and made him aware of msg per [employer]. I stressed the importance of him obtaining a pcp and provided contact information for him to call and establish. He is aware that our office will not approve any further refills of these medications. He verbalized his understanding and appreciation.

## 2018-02-24 ENCOUNTER — Other Ambulatory Visit: Payer: Self-pay

## 2018-02-24 NOTE — Telephone Encounter (Signed)
spironolactone (ALDACTONE) 25 MG tablet  Medication  Date: 01/19/2018 Department: [facility] TELEMETRY/UROLOGY [facility]ST Ordering/Authorizing: Rolly Salter, MD  Order Providers   Prescribing Provider Encounter Provider  Rolly Salter, MD None  Outpatient Medication Detail    Disp Refills Start End   spironolactone (ALDACTONE) 25 MG tablet 30 tablet 0 01/20/2018    Sig - Route: Take 0.5 tablets (12.5 mg total) by mouth daily. - Oral   Sent to pharmacy as: spironolactone (ALDACTONE) 25 MG tablet   E-Prescribing Status: Receipt confirmed by pharmacy (01/19/2018 12:46 PM EDT)   Pharmacy   [id number] - [place], [place] - [address] AT [facility] [address]

## 2018-02-26 ENCOUNTER — Other Ambulatory Visit: Payer: Self-pay | Admitting: Physician Assistant

## 2018-02-26 MED ORDER — SPIRONOLACTONE 25 MG PO TABS: 12.5000 mg | ORAL_TABLET | Freq: Every day | ORAL | 3 refills | Status: DC

## 2018-03-06 NOTE — Progress Notes (Signed)
Cardiology Office Note   Date:  03/07/2018   ID:  Logan Meadows, DOB [birth year], MRN [id number]  PCP:  Wallis Bamberg, PA-C  Cardiologist:  Dr. Eden Emms    Chief Complaint  Patient presents with  . Atrial Fibrillation    hyperthyroidism  . Cardiomyopathy      History of Present Illness: Logan Meadows is a 37 y.o. male who presents for a fib, cardiomyopathy and hyperthyroid.   He has a history of seasonal allergies, tobacco abuse, THC use, prior cocaine use, recently diagnosed atrial flutter/cardiomyopathy/hyperthyroidism  Was seen 12/2017 in hospital with wt loss, use of cocaine, and found to be a fib with RVR, and echo weith EF 25-30%, mild MR --+hyperthyroid with TSH undetectable.  Radioactive iodine uptake performed suggesting Graves' disease. He was started on methimazole and beta blocker  Difficult to control HR and dilt stopped due to low EF,  Amiodarone was avoided due to thyroid disease.  TEE 01/17/18 showing EF 30-35% with diffuse HK, mild MR, mild LAE, normal RV, no LAA thrombus or PFO -> received cardioversion to NSR HR 95-100. From a cardiac medication perspective he was discharged on carvedilol, Eliquis and     Last visit 01/29/18 feeling much better, NYHA 1.  Had not yet seen endocrine.  CHADSVASC is currently 1 for LV dysfunction plan to continue anticoagulation for 3 months after euthroid. .   Labs stable.   Today he feels well, no chest pain, no SOB.  Wt is up a few pounds but no edema.  No lightheadedness.  Is doing well on medication.  No bleeding on Eliquis.  We discussed cardiac rehab and will send in request.  He no longer using tobacco or drugs, ETOH has stopped coffee as well.  He is walking but no other exercise.  He is weighing daily and sticking to low salt diet.  He is working without problems.   He is seeing PCP tomorrow at [facility],/primary care.     Past Medical History:  Diagnosis Date  . Abnormal TSH 01/10/2018  . Alcohol use   . Atrial  flutter (HCC) 01/10/2018   a. dx 12/2017 in setting of hyperthyroidism, cocaine, alcohol, THC use.  . Cardiomyopathy (HCC)    a. suspected tachy-mediated - EF 20-25% by echo and 30-35% by TEE 12/2017.  Marland Kitchen Cocaine use   . Graves disease   . Hyperthyroidism   . Marijuana use   . Seasonal allergies     Past Surgical History:  Procedure Laterality Date  . CARDIOVERSION N/A 01/17/2018   Procedure: CARDIOVERSION;  Surgeon: Wendall Stade, MD;  Location: [facility] ENDOSCOPY;  Service: Cardiovascular;  Laterality: N/A;  . None    . TEE WITHOUT CARDIOVERSION N/A 01/17/2018   Procedure: TRANSESOPHAGEAL ECHOCARDIOGRAM (TEE);  Surgeon: Wendall Stade, MD;  Location: [facility] ENDOSCOPY;  Service: Cardiovascular;  Laterality: N/A;     Current Outpatient Medications  Medication Sig Dispense Refill  . acetaminophen (TYLENOL) 500 MG tablet Take 500 mg by mouth every 6 (six) hours as needed.    Marland Kitchen apixaban (ELIQUIS) 5 MG TABS tablet Take 1 tablet (5 mg total) by mouth 2 (two) times daily. 60 tablet 3  . carvedilol (COREG) 12.5 MG tablet Take 1 tablet (12.5 mg total) by mouth 2 (two) times daily with a meal. 180 tablet 3  . cetirizine (ZYRTEC) 10 MG tablet Take 10 mg by mouth daily.    . fluticasone (FLONASE) 50 MCG/ACT nasal spray Place 2 sprays into both nostrils  daily as needed for allergies or rhinitis.    . folic acid (FOLVITE) 1 MG tablet Take 1 tablet (1 mg total) by mouth daily. Further refills of this medication will need to be addressed by pcp 14 tablet 0  . losartan (COZAAR) 25 MG tablet Take 1 tablet (25 mg total) by mouth daily. 90 tablet 3  . methimazole (TAPAZOLE) 10 MG tablet Take 1 tablet (10 mg total) by mouth 2 (two) times daily. Further refills of this medication will need to be addressed by pcp 30 tablet 0  . spironolactone (ALDACTONE) 25 MG tablet Take 0.5 tablets (12.5 mg total) by mouth daily. 90 tablet 3  . thiamine 100 MG tablet Take 1 tablet (100 mg total) by mouth daily. 30 tablet 0   No  current facility-administered medications for this visit.     Allergies:   Patient has no known allergies.    Social History:  The patient  reports that he quit smoking about 6 weeks ago. He smoked 0.25 packs per day. He has never used smokeless tobacco. He reports that he drank about 7.2 oz of alcohol per week. He reports that he has current or past drug history. Drugs: Marijuana and Cocaine. Frequency: 7.00 times per week.   Family History:  The patient's family history includes Colon polyps in his father; Diabetes in his father and mother.    ROS:  General:no colds or fevers, mild weight increase Skin:no rashes or ulcers HEENT:no blurred vision, no congestion CV:see HPI PUL:see HPI GI:no diarrhea constipation or melena, no indigestion GU:no hematuria, no dysuria MS:no joint pain, no claudication Neuro:no syncope, no lightheadedness Endo:no diabetes, no thyroid disease  Wt Readings from Last 3 Encounters:  03/07/18 233 lb 12.8 oz (106.1 kg)  01/29/18 231 lb (104.8 kg)  01/19/18 226 lb 8 oz (102.7 kg)     PHYSICAL EXAM: VS:  BP (!) 120/56   Pulse 68   Ht 6\' 2"  (1.88 m)   Wt 233 lb 12.8 oz (106.1 kg)   SpO2 97%   BMI 30.02 kg/m  , BMI Body mass index is 30.02 kg/m. General:Pleasant affect, NAD Skin:Warm and dry, brisk capillary refill HEENT:normocephalic, sclera clear, mucus membranes moist Neck:supple, no JVD, no bruits  Heart:S1S2 RRR without murmur, gallup, rub or click Lungs:clear without rales, rhonchi, or wheezes PIR:JJOA, non tender, + BS, do not palpate liver spleen or masses Ext:no lower ext edema, 2+ pedal pulses, 2+ radial pulses Neuro:alert and oriented X 3, MAE, follows commands, + facial symmetry    EKG:  EKG is NOT ordered today.    Recent Labs: 01/11/2018: ALT 27; TSH <0.010 01/13/2018: B Natriuretic Peptide 170.8 01/16/2018: Magnesium 1.9 01/29/2018: Hemoglobin 14.6; Platelets 230 02/05/2018: BUN 15; Creatinine, Ser 0.74; Potassium 4.6; Sodium 141      Lipid Panel    Component Value Date/Time   CHOL 108 01/12/2018 0319   TRIG 47 01/12/2018 0319   HDL 32 (L) 01/12/2018 0319   CHOLHDL 3.4 01/12/2018 0319   VLDL 9 01/12/2018 0319   LDLCALC 67 01/12/2018 0319       Other studies Reviewed: Additional studies/ records that were reviewed today include: .  TEE  01/17/18 Study Conclusions  - Left ventricle: Diffuse hypo kinesis likely tachycardia induced   DCM given rapid flutter and hyperthyroidism. Systolic function   was severely reduced. The estimated ejection fraction was in the   range of 25% to 30%. - Aortic valve: No evidence of vegetation. - Mitral valve: There was mild  regurgitation. - Left atrium: The atrium was moderately dilated. No evidence of   thrombus in the atrial cavity or appendage. - Right ventricle: The cavity size was mildly dilated. - Right atrium: The atrium was dilated. - Atrial septum: No defect or patent foramen ovale was identified. - Pulmonic valve: No evidence of vegetation. - Impressions: No LAA thrombus   Anticoagulated     DCC x 1 200J converted from rapid atrial flutter rate 160 to NSR   rate 98   given 5 mg iv Lopressor to blunt adrenergic tone.     No immediate neurologic sequelae  Impressions:  - No LAA thrombus    Anticoagulated  ASSESSMENT AND PLAN:  1.  Atrial flutter most likely due to hyperthyroidism with substance abuse.  Since TEE DCCV pt is maintaining SR- by exam today, plan is for 3 months of Eliquis after euthyroid.  CHA2DS2VASc is 1 for LV dysfunction.  If LV function continues to be depressed will need ischemic work up and possible ICD.   2.  Cardiomyopathy - EF 25-30% most likely NICM.  Will plan for Echo in August to reeval since treatment and follow up with Dr. Eden Emms.  Today euvolemic.  Would like to have BMP today but he will see PCP tomorrow and most likely will have labs there to eval thyroid.  He is weighing daily and watching salt.  He would like to go to  cardiac rehab so will put in referral.   3.  Hyperthyroidism on methimazole to be seen by PCP tomorrow and may be referred to endocrinologist at that point.      4.  Polysubstance abuse, no longer using any substance.   Will ask PCP to check BMP tomorrow.   Current medicines are reviewed with the patient today.  The patient Has no concerns regarding medicines.  The following changes have been made:  See above Labs/ tests ordered today include:see above  Disposition:   FU:  see above  Signed, Nada Boozer, NP  03/07/2018 8:27 AM    [facility] [address] [place], [place], [place]  [address]/ [address] [place], [place] Phone: [phone]; Fax: [phone]  [phone]

## 2018-03-07 ENCOUNTER — Other Ambulatory Visit: Payer: Self-pay

## 2018-03-07 ENCOUNTER — Encounter (INDEPENDENT_AMBULATORY_CARE_PROVIDER_SITE_OTHER): Payer: Self-pay

## 2018-03-07 ENCOUNTER — Telehealth (HOSPITAL_COMMUNITY): Payer: Self-pay

## 2018-03-07 ENCOUNTER — Encounter: Payer: Self-pay | Admitting: Urgent Care

## 2018-03-07 ENCOUNTER — Ambulatory Visit (INDEPENDENT_AMBULATORY_CARE_PROVIDER_SITE_OTHER): Payer: BLUE CROSS/BLUE SHIELD | Admitting: Cardiology

## 2018-03-07 ENCOUNTER — Ambulatory Visit (INDEPENDENT_AMBULATORY_CARE_PROVIDER_SITE_OTHER): Payer: BLUE CROSS/BLUE SHIELD | Admitting: Urgent Care

## 2018-03-07 ENCOUNTER — Encounter: Payer: Self-pay | Admitting: Cardiology

## 2018-03-07 VITALS — BP 113/68 | HR 79 | Temp 98.5°F | Resp 18 | Ht 74.0 in | Wt 244.0 lb

## 2018-03-07 VITALS — BP 120/56 | HR 68 | Ht 74.0 in | Wt 233.8 lb

## 2018-03-07 DIAGNOSIS — Z23 Encounter for immunization: Secondary | ICD-10-CM | POA: Diagnosis not present

## 2018-03-07 DIAGNOSIS — E0590 Thyrotoxicosis, unspecified without thyrotoxic crisis or storm: Secondary | ICD-10-CM

## 2018-03-07 DIAGNOSIS — I5021 Acute systolic (congestive) heart failure: Secondary | ICD-10-CM

## 2018-03-07 DIAGNOSIS — I429 Cardiomyopathy, unspecified: Secondary | ICD-10-CM

## 2018-03-07 DIAGNOSIS — I483 Typical atrial flutter: Secondary | ICD-10-CM | POA: Diagnosis not present

## 2018-03-07 DIAGNOSIS — Z87898 Personal history of other specified conditions: Secondary | ICD-10-CM

## 2018-03-07 DIAGNOSIS — I4892 Unspecified atrial flutter: Secondary | ICD-10-CM

## 2018-03-07 DIAGNOSIS — F1910 Other psychoactive substance abuse, uncomplicated: Secondary | ICD-10-CM

## 2018-03-07 DIAGNOSIS — F1411 Cocaine abuse, in remission: Secondary | ICD-10-CM

## 2018-03-07 DIAGNOSIS — I4891 Unspecified atrial fibrillation: Secondary | ICD-10-CM

## 2018-03-07 DIAGNOSIS — F1291 Cannabis use, unspecified, in remission: Secondary | ICD-10-CM

## 2018-03-07 DIAGNOSIS — Z87891 Personal history of nicotine dependence: Secondary | ICD-10-CM

## 2018-03-07 NOTE — Progress Notes (Signed)
MRN: [id number] DOB: 1981-02-25  Subjective:   Logan Meadows is a 37 y.o. male presenting to establish care with primary care practice.  Patient was found to have atrial fibrillation secondary to hyperthyroidism during hospitalization in May 2019.  At that time patient reports that he was using drugs including marijuana, cocaine, smoking cigarettes.  He was found to have atrial flutter as well with cardiomyopathy and underwent a cardioversion successfully on 01/17/2018.  He has since been to his cardiologist for follow-up, last office visit was today 03/07/2018.  They recommended that he establish care at primary care practice and have a basic metabolic panel repeated.  He has not yet seen or establish care with an endocrinologist.  He is currently taking methimazole and will be running out of this medication in 2 days.  His last T4 and T3 levels were 4.13 and 8.8 respectively.  He had a negative thyroid peroxidase antibody and negative thyrotropin receptor autoantibodies.  He has maintained his apixaban, carvedilol, losartan, spironolactone.  He denies any drug use since his hospitalization.  Denies headaches, confusion, dizziness, chest pain, heart racing, palpitations, nausea, vomiting, belly pain, shortness of breath.  Logan Meadows has a current medication list which includes the following prescription(s): acetaminophen, apixaban, carvedilol, cetirizine, fluticasone, folic acid, losartan, methimazole, spironolactone, and thiamine. Also has No Known Allergies.  Logan Meadows  has a past medical history of Abnormal TSH (01/10/2018), Alcohol use, Atrial flutter (HCC) (01/10/2018), Cardiomyopathy (HCC), Cocaine use, Graves disease, Hyperthyroidism, Marijuana use, and Seasonal allergies. Also  has a past surgical history that includes None; Cardioversion (N/A, 01/17/2018); and TEE without cardioversion (N/A, 01/17/2018). His family history includes Colon polyps in his father; Diabetes in his father and mother.    Ebjective:   Vitals: BP 113/68   Pulse 79   Temp 98.5 F (36.9 C) (Oral)   Resp 18   Ht 6\' 2"  (1.88 m)   Wt 244 lb (110.7 kg)   SpO2 98%   BMI 31.33 kg/m   Physical Exam  Constitutional: He is oriented to person, place, and time. He appears well-developed and well-nourished.  HENT:  Mouth/Throat: Oropharynx is clear and moist.  Eyes: Pupils are equal, round, and reactive to light. EOM are normal. Right eye exhibits no discharge. Left eye exhibits no discharge. No scleral icterus.  Neck: Normal range of motion. Neck supple. No thyromegaly present.  Cardiovascular: Normal rate, regular rhythm, normal heart sounds and intact distal pulses. Exam reveals no gallop and no friction rub.  No murmur heard. Pulmonary/Chest: Effort normal and breath sounds normal. No stridor. No respiratory distress. He has no wheezes. He has no rales.  Abdominal: Soft. Bowel sounds are normal. He exhibits no distension and no mass. There is no tenderness. There is no rebound and no guarding.  Musculoskeletal: He exhibits no edema.  Neurological: He is alert and oriented to person, place, and time. He displays normal reflexes. Coordination normal.  Skin: Skin is warm and dry.  Psychiatric: He has a normal mood and affect.   Assessment and Plan :   Atrial fibrillation, unspecified type (HCC) - Plan: Basic metabolic panel  Cardiomyopathy, unspecified type (HCC)  Acute systolic (congestive) heart failure (HCC)  Atrial fibrillation with RVR (HCC)  Atrial flutter with rapid ventricular response (HCC)  Hyperthyroidism - Plan: Ambulatory referral to Endocrinology, Thyroid Panel With TSH  Hx of cocaine abuse  History of marijuana use  History of smoking for more than 10 years  Need for Tdap vaccination  Patient is medically  stable, labs pending.  I will await his thyroid panel results to make sure he gets the adequate dosing of methimazole.  I counseled that this is a medication that needs to be  managed by an endocrinologist.  At this point I will defer imaging to the specialist.  Patient is in agreement with this treatment plan.  He is to maintain follow-up as planned with the cardiologist.  Recommended he continue to abstain from all drugs including cocaine and marijuana.  He denies history of IV drug use.  Return to clinic precautions discussed.  Logan Bamberg, PA-C Primary Care at [facility] [phone] 03/07/2018  1:57 PM

## 2018-03-07 NOTE — Patient Instructions (Addendum)
Medication Instructions:  Your provider recommends that you continue on your current medications as directed. Please refer to the Current Medication list given to you today.     Labwork: You will have labs drawn when you return to see Dr. Eden Emms.  Testing/Procedures: Your provider has requested that you have an echocardiogram. Echocardiography is a painless test that uses sound waves to create images of your heart. It provides your doctor with information about the size and shape of your heart and how well your heart's chambers and valves are working. This procedure takes approximately one hour. There are no restrictions for this procedure. Your appointment is Tuesday, August 6th. Please arrive by 7:00AM for check-in.   Follow-Up: You have a follow-up appointment scheduled with Dr. Eden Emms on Wednesday, August 7th at 3:15PM. Please arrive by 3:00PM.  You have been referred to Cardiac Rehab. They will call you to arrange sessions.   Any Other Special Instructions Will Be Listed Below (If Applicable).     If you need a refill on your cardiac medications before your next appointment, please call your pharmacy.

## 2018-03-07 NOTE — Telephone Encounter (Signed)
Patients insurance is active and benefits verified through [place] - No co-pay, deductible amount of $2,000/$2,000 has been met, out of pocket amount of $4,000/$4,000 has been met, no co-insurance, and no pre-authorization is required. Passport/reference [id number]  Will contact patient to see if he is interested in the Cardiac Rehab Program. If interested, patient will be contacted for scheduling upon review by the RN Navigator.

## 2018-03-07 NOTE — Patient Instructions (Addendum)
Hyperthyroidism Hyperthyroidism is when the thyroid is too active (overactive). Your thyroid is a large gland that is located in your neck. The thyroid helps to control how your body uses food (metabolism). When your thyroid is overactive, it produces too much of a hormone called thyroxine. What are the causes? Causes of hyperthyroidism may include:  Graves disease. This is when your immune system attacks the thyroid gland. This is the most common cause.  Inflammation of the thyroid gland.  Tumor in the thyroid gland or somewhere else.  Excessive use of thyroid medicines, including: ? Prescription thyroid supplement. ? Herbal supplements that mimic thyroid hormones.  Solid or fluid-filled lumps within your thyroid gland (thyroid nodules).  Excessive ingestion of iodine.  What increases the risk?  Being male.  Having a family history of thyroid conditions. What are the signs or symptoms? Signs and symptoms of hyperthyroidism may include:  Nervousness.  Inability to tolerate heat.  Unexplained weight loss.  Diarrhea.  Change in the texture of hair or skin.  Heart skipping beats or making extra beats.  Rapid heart rate.  Loss of menstruation.  Shaky hands.  Fatigue.  Restlessness.  Increased appetite.  Sleep problems.  Enlarged thyroid gland or nodules.  How is this diagnosed? Diagnosis of hyperthyroidism may include:  Medical history and physical exam.  Blood tests.  Ultrasound tests.  How is this treated? Treatment may include:  Medicines to control your thyroid.  Surgery to remove your thyroid.  Radiation therapy.  Follow these instructions at home:  Take medicines only as directed by your health care provider.  Do not use any tobacco products, including cigarettes, chewing tobacco, or electronic cigarettes. If you need help quitting, ask your health care provider.  Do not exercise or do physical activity until your health care provider  approves.  Keep all follow-up appointments as directed by your health care provider. This is important. Contact a health care provider if:  Your symptoms do not get better with treatment.  You have fever.  You are taking thyroid replacement medicine and you: ? Have depression. ? Feel mentally and physically slow. ? Have weight gain. Get help right away if:  You have decreased alertness or a change in your awareness.  You have abdominal pain.  You feel dizzy.  You have a rapid heartbeat.  You have an irregular heartbeat. This information is not intended to replace advice given to you by your health care provider. Make sure you discuss any questions you have with your health care provider. Document Released: 08/13/2005 Document Revised: 01/12/2016 Document Reviewed: 12/29/2013 Elsevier Interactive Patient Education  2018 [employer].     IF you received an x-ray today, you will receive an invoice from [facility]. Please contact [facility] at [phone] with questions or concerns regarding your invoice.   IF you received labwork today, you will receive an invoice from [facility]. Please contact [facility] at [phone] with questions or concerns regarding your invoice.   Our billing staff will not be able to assist you with questions regarding bills from these companies.  You will be contacted with the lab results as soon as they are available. The fastest way to get your results is to activate your My Chart account. Instructions are located on the last page of this paperwork. If you have not heard from [place] regarding the results in 2 weeks, please contact this office.

## 2018-03-07 NOTE — Telephone Encounter (Signed)
Called patient to see if he is interested in the Cardiac Rehab Program - Patient stated he is interested. Explained scheduling process and went over insurance, patient verbalized understanding. Will contact patient for scheduling upon review by the RN Navigator. °

## 2018-03-08 LAB — THYROID PANEL WITH TSH
FREE THYROXINE INDEX: 1.7 (ref 1.2–4.9)
T3 UPTAKE RATIO: 32 % (ref 24–39)
T4 TOTAL: 5.4 ug/dL (ref 4.5–12.0)
TSH: <0.006 u[IU]/mL — ABNORMAL LOW (ref 0.450–4.500)

## 2018-03-08 LAB — BASIC METABOLIC PANEL
BUN/Creatinine Ratio: 14 (ref 9–20)
BUN: 11 mg/dL (ref 6–20)
CALCIUM: 9.4 mg/dL (ref 8.7–10.2)
CO2: 23 mmol/L (ref 20–29)
CREATININE: 0.77 mg/dL (ref 0.76–1.27)
Chloride: 108 mmol/L — ABNORMAL HIGH (ref 96–106)
GFR calc Af Amer: 135 mL/min/{1.73_m2} (ref >59)
GFR, EST NON AFRICAN AMERICAN: 117 mL/min/{1.73_m2} (ref >59)
Glucose: 110 mg/dL — ABNORMAL HIGH (ref 65–99)
Potassium: 3.9 mmol/L (ref 3.5–5.2)
Sodium: 143 mmol/L (ref 134–144)

## 2018-03-10 ENCOUNTER — Telehealth (HOSPITAL_COMMUNITY): Payer: Self-pay

## 2018-03-10 NOTE — Addendum Note (Signed)
Addended by: Garfield Cornea L on: 03/10/2018 08:17 AM   Modules accepted: Orders

## 2018-03-10 NOTE — Telephone Encounter (Signed)
Attempted to call patient in regards to Cardiac Rehab - LM on VM 

## 2018-03-11 ENCOUNTER — Other Ambulatory Visit: Payer: Self-pay | Admitting: Urgent Care

## 2018-03-11 LAB — T3, FREE: T3 FREE: 4.4 pg/mL (ref 2.0–4.4)

## 2018-03-11 LAB — T4, FREE: Free T4: 1.28 ng/dL (ref 0.82–1.77)

## 2018-03-11 MED ORDER — METHIMAZOLE 5 MG PO TABS: 5.0000 mg | ORAL_TABLET | Freq: Every day | ORAL | 0 refills | Status: DC

## 2018-03-11 MED ORDER — METHIMAZOLE 10 MG PO TABS: 10.0000 mg | ORAL_TABLET | Freq: Every day | ORAL | 0 refills | Status: DC

## 2018-03-12 ENCOUNTER — Telehealth: Payer: Self-pay | Admitting: Urgent Care

## 2018-03-12 NOTE — Telephone Encounter (Signed)
Copied from CRM [id number]. Topic: Inquiry >> Mar 11, 2018  5:55 PM Logan Meadows wrote: Reason for CRM: pt called to return phone call by pcp; pt states its better to call around noon  See lab notes from [facility]

## 2018-03-17 ENCOUNTER — Encounter (HOSPITAL_COMMUNITY): Payer: Self-pay

## 2018-03-17 ENCOUNTER — Telehealth (HOSPITAL_COMMUNITY): Payer: Self-pay

## 2018-03-17 NOTE — Telephone Encounter (Signed)
Please schedule patient for follow up on his hyperthyroidism on 08/14-15/2019.

## 2018-03-17 NOTE — Telephone Encounter (Signed)
Attempted to call pt a 2nd time- LM ON VM ° °Mailed letter out °

## 2018-03-17 NOTE — Telephone Encounter (Signed)
Call pt bk at your earliest convenience

## 2018-03-24 ENCOUNTER — Telehealth (HOSPITAL_COMMUNITY): Payer: Self-pay

## 2018-03-24 NOTE — Telephone Encounter (Signed)
3rd attempt to contact patient in regards to Cardiac Rehab - Could not leave voicemail.

## 2018-04-01 ENCOUNTER — Other Ambulatory Visit: Payer: Self-pay | Admitting: *Deleted

## 2018-04-01 ENCOUNTER — Other Ambulatory Visit: Payer: Self-pay

## 2018-04-01 ENCOUNTER — Ambulatory Visit (HOSPITAL_COMMUNITY): Payer: BLUE CROSS/BLUE SHIELD | Attending: Cardiology

## 2018-04-01 DIAGNOSIS — I483 Typical atrial flutter: Secondary | ICD-10-CM

## 2018-04-01 DIAGNOSIS — I4892 Unspecified atrial flutter: Secondary | ICD-10-CM | POA: Diagnosis not present

## 2018-04-01 DIAGNOSIS — I429 Cardiomyopathy, unspecified: Secondary | ICD-10-CM | POA: Diagnosis present

## 2018-04-01 DIAGNOSIS — I4891 Unspecified atrial fibrillation: Secondary | ICD-10-CM | POA: Insufficient documentation

## 2018-04-01 DIAGNOSIS — F1910 Other psychoactive substance abuse, uncomplicated: Secondary | ICD-10-CM | POA: Insufficient documentation

## 2018-04-02 ENCOUNTER — Ambulatory Visit (INDEPENDENT_AMBULATORY_CARE_PROVIDER_SITE_OTHER): Payer: BLUE CROSS/BLUE SHIELD | Admitting: Cardiovascular Disease

## 2018-04-02 ENCOUNTER — Encounter: Payer: Self-pay | Admitting: Cardiovascular Disease

## 2018-04-02 VITALS — BP 116/78 | HR 51 | Ht 74.0 in | Wt 239.2 lb

## 2018-04-02 DIAGNOSIS — I429 Cardiomyopathy, unspecified: Secondary | ICD-10-CM | POA: Diagnosis not present

## 2018-04-02 NOTE — Patient Instructions (Signed)
Medication Instructions:  Your physician recommends that you continue on your current medications as directed. Please refer to the Current Medication list given to you today.  Labwork: NONE  Testing/Procedures: Your physician has requested that you have an echocardiogram. Echocardiography is a painless test that uses sound waves to create images of your heart. It provides your doctor with information about the size and shape of your heart and how well your heart's chambers and valves are working. This procedure takes approximately one hour. There are no restrictions for this procedure.  Follow-Up: Your physician wants you to follow-up in: 3 months with Dr. Nishan.    If you need a refill on your cardiac medications before your next appointment, please call your pharmacy.     

## 2018-04-02 NOTE — Progress Notes (Signed)
Cardiology Office Note   Date:  04/02/2018   ID:  PALASH DOUSE, DOB Apr 12, 1981, MRN [id number]  PCP:  Wallis Bamberg, PA-C  Cardiologist:  Dr. Eden Emms    No chief complaint on file.     History of Present Illness: Logan Meadows is a 37 y.o. male who presents for a fib, cardiomyopathy and hyperthyroid. He has a history of substance abuse THC, cocaine and smoking First seen [facility] EF May 2019 with EF 25-30% rapid afib and very hyperthyroid. TEE /  [facility] done due to hard to control rate and CHF. EF 30-35% on TEE Placed on beta blocker and methimazole TSH still suppressed but T3/T4 down. Maintaining NSR EF improved by TTE 78/619 50-55%   Feels better no cardiac symptoms    Past Medical History:  Diagnosis Date  . Abnormal TSH 01/10/2018  . Alcohol use   . Atrial flutter (HCC) 01/10/2018   a. dx 12/2017 in setting of hyperthyroidism, cocaine, alcohol, THC use.  . Cardiomyopathy (HCC)    a. suspected tachy-mediated - EF 20-25% by echo and 30-35% by TEE 12/2017.  Marland Kitchen Cocaine use   . Graves disease   . Hyperthyroidism   . Marijuana use   . Seasonal allergies     Past Surgical History:  Procedure Laterality Date  . CARDIOVERSION N/A 01/17/2018   Procedure: CARDIOVERSION;  Surgeon: Wendall Stade, MD;  Location: [facility] ENDOSCOPY;  Service: Cardiovascular;  Laterality: N/A;  . None    . TEE WITHOUT CARDIOVERSION N/A 01/17/2018   Procedure: TRANSESOPHAGEAL ECHOCARDIOGRAM (TEE);  Surgeon: Wendall Stade, MD;  Location: [facility] ENDOSCOPY;  Service: Cardiovascular;  Laterality: N/A;     Current Outpatient Medications  Medication Sig Dispense Refill  . acetaminophen (TYLENOL) 500 MG tablet Take 500 mg by mouth every 6 (six) hours as needed.    Marland Kitchen apixaban (ELIQUIS) 5 MG TABS tablet Take 1 tablet (5 mg total) by mouth 2 (two) times daily. 60 tablet 3  . carvedilol (COREG) 12.5 MG tablet Take 1 tablet (12.5 mg total) by mouth 2 (two) times daily with a meal. 180 tablet 3  . cetirizine (ZYRTEC) 10  MG tablet Take 10 mg by mouth daily.    . fluticasone (FLONASE) 50 MCG/ACT nasal spray Place 2 sprays into both nostrils daily as needed for allergies or rhinitis.    Marland Kitchen losartan (COZAAR) 25 MG tablet Take 1 tablet (25 mg total) by mouth daily. 90 tablet 3  . methimazole (TAPAZOLE) 10 MG tablet Take 1 tablet (10 mg total) by mouth daily. 30 tablet 0  . methimazole (TAPAZOLE) 5 MG tablet Take 1 tablet (5 mg total) by mouth daily. 30 tablet 0  . spironolactone (ALDACTONE) 25 MG tablet Take 0.5 tablets (12.5 mg total) by mouth daily. 90 tablet 3   No current facility-administered medications for this visit.     Allergies:   Patient has no known allergies.    Social History:  The patient  reports that he quit smoking about 2 months ago. He smoked 0.25 packs per day. He has never used smokeless tobacco. He reports that he drank about 7.2 oz of alcohol per week. He reports that he has current or past drug history. Drugs: Marijuana and Cocaine. Frequency: 7.00 times per week.   Family History:  The patient's family history includes Colon polyps in his father; Diabetes in his father and mother.    ROS:  General:no colds or fevers, mild weight increase Skin:no rashes or ulcers HEENT:no blurred vision,  no congestion CV:see HPI PUL:see HPI GI:no diarrhea constipation or melena, no indigestion GU:no hematuria, no dysuria MS:no joint pain, no claudication Neuro:no syncope, no lightheadedness Endo:no diabetes, no thyroid disease  Wt Readings from Last 3 Encounters:  04/02/18 239 lb 4 oz (108.5 kg)  03/07/18 244 lb (110.7 kg)  03/07/18 233 lb 12.8 oz (106.1 kg)     PHYSICAL EXAM: VS:  BP 116/78   Pulse (!) 51   Ht 6\' 2"  (1.88 m)   Wt 239 lb 4 oz (108.5 kg)   SpO2 98%   BMI 30.72 kg/m  , BMI Body mass index is 30.72 kg/m. General:Pleasant affect, NAD Skin:Warm and dry, brisk capillary refill HEENT:normocephalic, sclera clear, mucus membranes moist Neck:supple, no JVD, no bruits    Heart:S1S2 RRR without murmur, gallup, rub or click Lungs:clear without rales, rhonchi, or wheezes ZOX:WRUE, non tender, + BS, do not palpate liver spleen or masses Ext:no lower ext edema, 2+ pedal pulses, 2+ radial pulses Neuro:alert and oriented X 3, MAE, follows commands, + facial symmetry    EKG:  EKG is NOT ordered today.    Recent Labs: 01/11/2018: ALT 27 01/13/2018: B Natriuretic Peptide 170.8 01/16/2018: Magnesium 1.9 01/29/2018: Hemoglobin 14.6; Platelets 230 03/07/2018: BUN 11; Creatinine, Ser 0.77; Potassium 3.9; Sodium 143; TSH <0.006    Lipid Panel    Component Value Date/Time   CHOL 108 01/12/2018 0319   TRIG 47 01/12/2018 0319   HDL 32 (L) 01/12/2018 0319   CHOLHDL 3.4 01/12/2018 0319   VLDL 9 01/12/2018 0319   LDLCALC 67 01/12/2018 0319       Other studies Reviewed: Additional studies/ records that were reviewed today include: .  TEE  01/17/18 Study Conclusions  - Left ventricle: Diffuse hypo kinesis likely tachycardia induced   DCM given rapid flutter and hyperthyroidism. Systolic function   was severely reduced. The estimated ejection fraction was in the   range of 25% to 30%. - Aortic valve: No evidence of vegetation. - Mitral valve: There was mild regurgitation. - Left atrium: The atrium was moderately dilated. No evidence of   thrombus in the atrial cavity or appendage. - Right ventricle: The cavity size was mildly dilated. - Right atrium: The atrium was dilated. - Atrial septum: No defect or patent foramen ovale was identified. - Pulmonic valve: No evidence of vegetation. - Impressions: No LAA thrombus   Anticoagulated     DCC x 1 200J converted from rapid atrial flutter rate 160 to NSR   rate 98   given 5 mg iv Lopressor to blunt adrenergic tone.     No immediate neurologic sequelae  Impressions:  - No LAA thrombus    Anticoagulated  ASSESSMENT AND PLAN:  1.  Atrial flutter most likely due to hyperthyroidism with substance abuse.   Since TEE DCCV pt is maintaining SR- by exam today, plan is for 3 months of Eliquis after euthyroid.  CHA2DS2VASc is 1 for LV dysfunction.  If LV function continues to be depressed will need ischemic work up and possible ICD.   2.  Cardiomyopathy - EF 25-30% most likely NICM and rapid afib TTE reviewed from 04/01/18 and improved 50-55% continue medical Rx will Order cardiac CT to r/o CAD and ischemic DCM  3.  Hyperthyroidism on methimazole and beta blocker TSH still .006 03/07/18  But T3 T4 way down   4.  Polysubstance abuse, no longer using any substance.    Disposition:   FU: me in 6 months   Signed, Theron Arista  Eden Emms, MD  04/02/2018 4:46 PM    [facility] [address] [place], [place], [place]  [address]/ [address] [place], [place] Phone: [phone]; Fax: [phone]  [phone]

## 2018-04-06 ENCOUNTER — Other Ambulatory Visit: Payer: Self-pay | Admitting: Urgent Care

## 2018-04-07 NOTE — Telephone Encounter (Signed)
methimazole refill 5 mg Last Refill:03/11/18 # 30 No RF Last OV: 03/07/18 PCP: Wallis Bamberg PA Pharmacy:[facility] [address]  Methimazole 10 mg refill Last Refill:03/11/18 # 30 No RF Last OV: 03/07/18 Last TSH: 03/07/18

## 2018-04-07 NOTE — Telephone Encounter (Signed)
Medication refilled for his methimazole.  Patient is supposed to be on 10 mg in the morning and 5 mg at bedtime.  Plan is to recheck his levels this week.

## 2018-04-09 ENCOUNTER — Encounter: Payer: Self-pay | Admitting: Urgent Care

## 2018-04-09 ENCOUNTER — Ambulatory Visit (INDEPENDENT_AMBULATORY_CARE_PROVIDER_SITE_OTHER): Payer: BLUE CROSS/BLUE SHIELD | Admitting: Endocrinology

## 2018-04-09 ENCOUNTER — Ambulatory Visit (INDEPENDENT_AMBULATORY_CARE_PROVIDER_SITE_OTHER): Payer: BLUE CROSS/BLUE SHIELD | Admitting: Urgent Care

## 2018-04-09 ENCOUNTER — Encounter: Payer: Self-pay | Admitting: Endocrinology

## 2018-04-09 VITALS — BP 134/76 | HR 57 | Temp 98.5°F | Resp 18 | Ht 74.0 in | Wt 239.2 lb

## 2018-04-09 VITALS — BP 124/78 | HR 50 | Ht 74.0 in | Wt 238.4 lb

## 2018-04-09 DIAGNOSIS — E0590 Thyrotoxicosis, unspecified without thyrotoxic crisis or storm: Secondary | ICD-10-CM

## 2018-04-09 DIAGNOSIS — I429 Cardiomyopathy, unspecified: Secondary | ICD-10-CM

## 2018-04-09 DIAGNOSIS — I4891 Unspecified atrial fibrillation: Secondary | ICD-10-CM | POA: Diagnosis not present

## 2018-04-09 DIAGNOSIS — I5021 Acute systolic (congestive) heart failure: Secondary | ICD-10-CM

## 2018-04-09 LAB — TSH

## 2018-04-09 LAB — T4, FREE: FREE T4: 0.56 ng/dL — AB (ref 0.60–1.60)

## 2018-04-09 NOTE — Progress Notes (Signed)
Subjective:    Patient ID: Logan Meadows, male    DOB: 01-27-1981, 37 y.o.   MRN: [id number]  HPI Pt is referred by Wallis Bamberg, PA, for hyperthyroidism.  Pt reports he was dx'ed with hyperthyroidism in mid-2019, when he was in the hospital with AF, but he had also used cocaine just prior to admission.  He was rx'ed tapazole and coreg.  He says he sometimes misses the tapazole.  He feels better since then.  He has never had XRT to the anterior neck, or thyroid surgery.  He does not consume kelp or any other prescribed or non-prescribed thyroid medication.  He has never been on amiodarone. He reports moderate weight loss, and assoc diarrhea.  Past Medical History:  Diagnosis Date  . Abnormal TSH 01/10/2018  . Alcohol use   . Atrial flutter (HCC) 01/10/2018   a. dx 12/2017 in setting of hyperthyroidism, cocaine, alcohol, THC use.  . Cardiomyopathy (HCC)    a. suspected tachy-mediated - EF 20-25% by echo and 30-35% by TEE 12/2017.  Marland Kitchen Cocaine use   . Graves disease   . Hyperthyroidism   . Marijuana use   . Seasonal allergies     Past Surgical History:  Procedure Laterality Date  . CARDIOVERSION N/A 01/17/2018   Procedure: CARDIOVERSION;  Surgeon: Wendall Stade, MD;  Location: [facility] ENDOSCOPY;  Service: Cardiovascular;  Laterality: N/A;  . None    . TEE WITHOUT CARDIOVERSION N/A 01/17/2018   Procedure: TRANSESOPHAGEAL ECHOCARDIOGRAM (TEE);  Surgeon: Wendall Stade, MD;  Location: [facility] ENDOSCOPY;  Service: Cardiovascular;  Laterality: N/A;    Social History   Socioeconomic History  . Marital status: Single    Spouse name: Not on file  . Number of children: Not on file  . Years of education: Not on file  . Highest education level: Not on file  Occupational History  . Occupation: Sports administrator person    Employer: [employer]  Social Needs  . Financial resource strain: Not on file  . Food insecurity:    Worry: Not on file    Inability: Not on file  . Transportation needs:    Medical: Not  on file    Non-medical: Not on file  Tobacco Use  . Smoking status: Former Smoker    Packs/day: 0.25    Last attempt to quit: 01/19/2018    Years since quitting: 0.2  . Smokeless tobacco: Never Used  . Tobacco comment: 1 pack on the weekends  Substance and Sexual Activity  . Alcohol use: Not Currently    Alcohol/week: 12.0 standard drinks    Types: 12 Cans of beer per week    Comment: 12 pack per weekend  . Drug use: Not Currently    Frequency: 7.0 times per week    Types: Marijuana, Cocaine    Comment: daily marijuana, cocaine on weekends  . Sexual activity: Not on file  Lifestyle  . Physical activity:    Days per week: Not on file    Minutes per session: Not on file  . Stress: Not on file  Relationships  . Social connections:    Talks on phone: Not on file    Gets together: Not on file    Attends religious service: Not on file    Active member of club or organization: Not on file    Attends meetings of clubs or organizations: Not on file    Relationship status: Not on file  . Intimate partner violence:    Fear of  current or ex partner: Not on file    Emotionally abused: Not on file    Physically abused: Not on file    Forced sexual activity: Not on file  Other Topics Concern  . Not on file  Social History Narrative   Pt lives with his mother.     Current Outpatient Medications on File Prior to Visit  Medication Sig Dispense Refill  . acetaminophen (TYLENOL) 500 MG tablet Take 500 mg by mouth every 6 (six) hours as needed.    Marland Kitchen apixaban (ELIQUIS) 5 MG TABS tablet Take 1 tablet (5 mg total) by mouth 2 (two) times daily. 60 tablet 3  . carvedilol (COREG) 12.5 MG tablet Take 1 tablet (12.5 mg total) by mouth 2 (two) times daily with a meal. 180 tablet 3  . cetirizine (ZYRTEC) 10 MG tablet Take 10 mg by mouth daily.    . fluticasone (FLONASE) 50 MCG/ACT nasal spray Place 2 sprays into both nostrils daily as needed for allergies or rhinitis.    Marland Kitchen losartan (COZAAR) 25 MG  tablet Take 1 tablet (25 mg total) by mouth daily. 90 tablet 3  . spironolactone (ALDACTONE) 25 MG tablet Take 0.5 tablets (12.5 mg total) by mouth daily. 90 tablet 3   No current facility-administered medications on file prior to visit.     No Known Allergies  Family History  Problem Relation Age of Onset  . Diabetes Mother   . Diabetes Father   . Colon polyps Father   . Thyroid disease Neg Hx     BP 124/78 (BP Location: Left Arm, Patient Position: Sitting, Cuff Size: Normal)   Pulse (!) 50   Ht 6\' 2"  (1.88 m)   Wt 238 lb 6.4 oz (108.1 kg)   SpO2 98%   BMI 30.61 kg/m     Review of Systems denies headache, hoarseness, diplopia, sob, nocturia, muscle weakness, edema, excessive diaphoresis, tremor, easy bruising, fever, and rhinorrhea.  He has fatigue, anxiety, heat intolerance, and palpitations.       Objective:   Physical Exam VS: see vs page GEN: no distress HEAD: head: no deformity eyes: no periorbital swelling; slight bilat proptosis external nose and ears are normal mouth: no lesion seen NECK: thyroid is 2-3 times normal size, diffuse CHEST WALL: no deformity LUNGS: clear to auscultation CV: slow rate, but reg rhythm, no murmur ABD: abdomen is soft, nontender.  no hepatosplenomegaly.  not distended.  no hernia MUSCULOSKELETAL: muscle bulk and strength are grossly normal.  no obvious joint swelling.  gait is normal and steady EXTEMITIES: no deformity.  no ulcer on the feet.  feet are of normal color and temp.  no edema PULSES: dorsalis pedis intact bilat.  no carotid bruit NEURO:  cn 2-12 grossly intact.   readily moves all 4's.  sensation is intact to touch on the feet.  No tremor.   SKIN:  Normal texture and temperature.  No rash or suspicious lesion is visible.  Not diaphoretic NODES:  None palpable at the neck PSYCH: alert, well-oriented.  Does not appear anxious nor depressed.  nuc med scan: Elevated 4 hour and 24 hour radio iodine uptakes image shows Graves  disease.  Lab Results  Component Value Date   TSH <0.01 Repeated and verified X2. (L) 04/09/2018   T4TOTAL 5.4 03/07/2018      Assessment & Plan:  Grave's Dz, new Hyperthyroidism: due to Grave's Dz AF: he needs prompt improvement in TFT Cocaine abuse: this could also contribute to AF.  Patient Instructions  blood tests are requested for you today.  We'll let you know about the results. If ever you have fever while taking methimazole, stop it and call us, even if the reason is obvious, because of the risk of a rare side-effect.    When the thyroid is better, you could consider taking the radioactive iodine pull.  It works like this:  We would first check a thyroid "scan" (a special, but easy and painless type of thyroid x ray).  you go to the x-ray department of the hospital to swallow a pill, which contains a miniscule amount of radiation.  You will not notice any symptoms from this.  You will go back to the x-ray department the next day, to lie down in front of a camera.  The results of this will be sent to me.   Based on the results, i hope to order for you a treatment pill of radioactive iodine.  Although it is a larger amount of radiation, you will again notice no symptoms from this.  The pill is gone from your body in a few days (during which you should stay away from other people), but takes several months to work.  Therefore, please return here approximately 6-8 weeks after the treatment.  This treatment has been available for many years, and the only known side-effect is an underactive thyroid.  It is possible that i would eventually prescribe for you a thyroid hormone pill, which is very inexpensive.  You don't have to worry about side-effects of this thyroid hormone pill, because it is the same molecule your thyroid makes.

## 2018-04-09 NOTE — Progress Notes (Signed)
    MRN: [id number] DOB: 1981/05/04  Subjective:   Logan Meadows is a 37 y.o. male presenting for follow up on hyperthyroidism.  He has not been seen by an endocrinologist, is supposed to be on 10 mg of methimazole in the morning and 5 mg at bedtime.  His last free T4 and free T3 from July had trended back down toward the upper ends of normal.  He is here to have this rechecked.  He is also seen his cardiologist again, had an echo done that showed a normal EF.  All his medications were maintained.  He is supposed to follow-up in 6 months with his cardiologist and will be further evaluated for CAD.  Today patient reports that he feels much better, denies fever, headaches, dizziness, chest pain, heart racing, palpitations, nausea, vomiting, belly pain.  He has not used any drugs as he was before.  Logan Meadows has a current medication list which includes the following prescription(s): acetaminophen, apixaban, carvedilol, cetirizine, fluticasone, losartan, methimazole, methimazole, and spironolactone. Also has No Known Allergies.  Logan Meadows  has a past medical history of Abnormal TSH (01/10/2018), Alcohol use, Atrial flutter (HCC) (01/10/2018), Cardiomyopathy (HCC), Cocaine use, Graves disease, Hyperthyroidism, Marijuana use, and Seasonal allergies. Also  has a past surgical history that includes None; Cardioversion (N/A, 01/17/2018); and TEE without cardioversion (N/A, 01/17/2018).  Objective:   Vitals: BP 134/76   Pulse (!) 57   Temp 98.5 F (36.9 C) (Oral)   Resp 18   Ht 6\' 2"  (1.88 m)   Wt 239 lb 3.2 oz (108.5 kg)   SpO2 (!) 57%   BMI 30.71 kg/m   Physical Exam  Constitutional: He is oriented to person, place, and time. He appears well-developed and well-nourished.  HENT:  Mouth/Throat: Oropharynx is clear and moist.  Eyes: Right eye exhibits no discharge. Left eye exhibits no discharge. No scleral icterus.  Cardiovascular: Normal rate, regular rhythm, normal heart sounds and intact distal  pulses. Exam reveals no gallop and no friction rub.  No murmur heard. Pulmonary/Chest: Effort normal and breath sounds normal. No stridor. No respiratory distress. He has no wheezes. He has no rales.  Neurological: He is alert and oriented to person, place, and time.  Skin: Skin is warm and dry.  Psychiatric: He has a normal mood and affect.   Assessment and Plan :   Hyperthyroidism - Plan: T4, Free, T3, Free  Atrial fibrillation, unspecified type (HCC)  Cardiomyopathy, unspecified type (HCC)  Acute systolic (congestive) heart failure (HCC)  Reviewed patient's echocardiogram with him.  He is doing much better.  Message sent to our referral staff for follow-up on his referral to endocrinology.  Labs pending, will refill his methimazole as appropriate.  Follow-up in 4 weeks for recheck on his labs.  Wallis Bamberg, PA-C [facility] [phone] 04/09/2018 8:14 AM

## 2018-04-09 NOTE — Patient Instructions (Addendum)
blood tests are requested for you today.  We'll let you know about the results. If ever you have fever while taking methimazole, stop it and call us, even if the reason is obvious, because of the risk of a rare side-effect.    When the thyroid is better, you could consider taking the radioactive iodine pull.  It works like this:  We would first check a thyroid "scan" (a special, but easy and painless type of thyroid x ray).  you go to the x-ray department of the hospital to swallow a pill, which contains a miniscule amount of radiation.  You will not notice any symptoms from this.  You will go back to the x-ray department the next day, to lie down in front of a camera.  The results of this will be sent to me.   Based on the results, i hope to order for you a treatment pill of radioactive iodine.  Although it is a larger amount of radiation, you will again notice no symptoms from this.  The pill is gone from your body in a few days (during which you should stay away from other people), but takes several months to work.  Therefore, please return here approximately 6-8 weeks after the treatment.  This treatment has been available for many years, and the only known side-effect is an underactive thyroid.  It is possible that i would eventually prescribe for you a thyroid hormone pill, which is very inexpensive.  You don't have to worry about side-effects of this thyroid hormone pill, because it is the same molecule your thyroid makes.

## 2018-04-09 NOTE — Patient Instructions (Addendum)
I will contact you with your lab results within the next 2 weeks.  If you have not heard from [place] then please contact us. The fastest way to get your results is to register for My Chart.    Methimazole tablets What is this medicine? METHIMAZOLE (meth IM a zole) prevents the thyroid gland from producing too much thyroid hormone. It is used to treat a condition known as hyperthyroidism. This medicine may be used for other purposes; ask your health care provider or pharmacist if you have questions. COMMON BRAND NAME(S): Northyx, Tapazole What should I tell my health care provider before I take this medicine? They need to know if you have any of these conditions: -bone marrow disease -liver disease -an unusual or allergic reaction to methimazole, other medicines, foods, dyes, or preservatives -pregnant or trying to get pregnant -breast-feeding How should I use this medicine? Take this medicine by mouth with a glass of water. Follow the directions on the prescription label. You can take this medicine with or without food. However, you should always take it the same way to make sure the effects are the same. Take your doses at regular intervals. Do not take your medicine more often than directed. Do not stop taking this medicine except on the advice of your doctor or health care professional. Talk to your pediatrician regarding the use of this medicine in children. Special care may be needed. While this drug may be prescribed for children for selected conditions, precautions do apply. Overdosage: If you think you have taken too much of this medicine contact a poison control center or emergency room at once. NOTE: This medicine is only for you. Do not share this medicine with others. What if I miss a dose? If you miss a dose, take it as soon as you can. If it is almost time for your next dose, take only that dose. Do not take double or extra doses. What may interact with this medicine? Do not take  this medicine with any of the following medications: -sodium iodide -thyroid hormones This medicine may also interact with the following medications: -certain medicines for high blood pressure like metoprolol and propranolol -digoxin -theophylline -warfarin This list may not describe all possible interactions. Give your health care provider a list of all the medicines, herbs, non-prescription drugs, or dietary supplements you use. Also tell them if you smoke, drink alcohol, or use illegal drugs. Some items may interact with your medicine. What should I watch for while using this medicine? Visit your doctor or health care professional for regular checks on your progress. Your thyroid hormone levels will need to be checked. This medicine can reduce your resistance to infection. Contact your doctor or health care professional if you have any infection or injury. Avoid people who have colds, flu, bronchitis or other infectious disease. Do not have any vaccinations without asking your doctor or health care professional. Avoid people who have recently received oral polio vaccine. What side effects may I notice from receiving this medicine? Side effects that you should report to your doctor or health care professional as soon as possible: -black, tarry stools -fever, sore throat, hoarseness -numbness or tingling in the hands or feet -severe redness or itching of the skin, or dry cracked skin -stomach pain -swelling of the feet or legs -unusual bleeding or bruising, pinpoint red spots on the skin -unusual or sudden weight increase -unusually weak or tired -yellowing of skin or eyes Side effects that usually do not require medical  attention (report to your doctor or health care professional if they continue or are bothersome): -headache -nausea, vomiting -mild skin rash, itching -muscle aches and pains This list may not describe all possible side effects. Call your doctor for medical advice about  side effects. You may report side effects to FDA at 1-800-FDA-1088. Where should I keep my medicine? Keep out of the reach of children. Store at room temperature between 15 and 30 degrees C (59 and 86 degrees F). Throw away any unused medicine after the expiration date. NOTE: This sheet is a summary. It may not cover all possible information. If you have questions about this medicine, talk to your doctor, pharmacist, or health care provider.  2018 Elsevier/Gold Standard (2008-03-01 15:59:43)     IF you received an x-ray today, you will receive an invoice from [facility]. Please contact [facility] at [phone] with questions or concerns regarding your invoice.   IF you received labwork today, you will receive an invoice from [facility]. Please contact [facility] at [phone] with questions or concerns regarding your invoice.   Our billing staff will not be able to assist you with questions regarding bills from these companies.  You will be contacted with the lab results as soon as they are available. The fastest way to get your results is to activate your My Chart account. Instructions are located on the last page of this paperwork. If you have not heard from [place] regarding the results in 2 weeks, please contact this office.

## 2018-04-10 ENCOUNTER — Other Ambulatory Visit: Payer: Self-pay | Admitting: Urgent Care

## 2018-04-10 LAB — T3, FREE: T3 FREE: 2.3 pg/mL (ref 2.0–4.4)

## 2018-04-10 LAB — T4, FREE: FREE T4: 0.6 ng/dL — AB (ref 0.82–1.77)

## 2018-04-10 MED ORDER — METHIMAZOLE 5 MG PO TABS: 5.0000 mg | ORAL_TABLET | Freq: Two times a day (BID) | ORAL | 1 refills | Status: DC

## 2018-04-18 ENCOUNTER — Telehealth: Payer: Self-pay | Admitting: Urgent Care

## 2018-04-21 ENCOUNTER — Telehealth: Payer: Self-pay | Admitting: Urgent Care

## 2018-04-21 NOTE — Telephone Encounter (Signed)
Pt called in for his lab results.   When I pulled up the results the results were from Dr. Romero Belling, endocrinologist.   I let pt know he needed to call Dr. George Hugh office.    "I just called the number that was on my caller ID is how I called here".    He is going to call Dr. George Hugh office.  As I was closing his chart I noticed there were also thyroid test results from Wallis Bamberg, PA from [facility].    I called the flow coordinator there and let her know there was a message from [facility] with lab results and orders.    She said the endocrinologist's order would be what the pt should follow.   Once they are seen by the specialist their orders were the ones to follow so I was correct in having the pt call Dr. George Hugh office for his lab results.

## 2018-04-22 ENCOUNTER — Telehealth: Payer: Self-pay | Admitting: Endocrinology

## 2018-04-22 NOTE — Telephone Encounter (Signed)
Patient returned call. Please call patient at ph# [phone].

## 2018-04-23 NOTE — Telephone Encounter (Signed)
LVM that labs were improved & continue same meds. I asked that he call back to make 1 month follow up appointment,

## 2018-05-08 ENCOUNTER — Encounter: Payer: Self-pay | Admitting: Urgent Care

## 2018-05-08 ENCOUNTER — Ambulatory Visit (INDEPENDENT_AMBULATORY_CARE_PROVIDER_SITE_OTHER): Payer: BLUE CROSS/BLUE SHIELD | Admitting: Urgent Care

## 2018-05-08 ENCOUNTER — Other Ambulatory Visit: Payer: Self-pay

## 2018-05-08 VITALS — BP 122/75 | HR 62 | Temp 98.0°F | Resp 18 | Ht 74.0 in | Wt 246.6 lb

## 2018-05-08 DIAGNOSIS — Z23 Encounter for immunization: Secondary | ICD-10-CM | POA: Diagnosis not present

## 2018-05-08 DIAGNOSIS — R635 Abnormal weight gain: Secondary | ICD-10-CM | POA: Diagnosis not present

## 2018-05-08 DIAGNOSIS — E0590 Thyrotoxicosis, unspecified without thyrotoxic crisis or storm: Secondary | ICD-10-CM

## 2018-05-08 NOTE — Patient Instructions (Addendum)
Dr. Alvy Bimler, Dr. Leretha Pol, Dr. Creta Levin and Dr. Neva Seat will remain at Primary Care at [facility] for the foreseeable future.   [facility] Address: [address], [place], [place] [address]  [phone] I'll be starting in October 2019.     Methimazole tablets What is this medicine? METHIMAZOLE (meth IM a zole) prevents the thyroid gland from producing too much thyroid hormone. It is used to treat a condition known as hyperthyroidism. This medicine may be used for other purposes; ask your health care provider or pharmacist if you have questions. COMMON BRAND NAME(S): Northyx, Tapazole What should I tell my health care provider before I take this medicine? They need to know if you have any of these conditions: -bone marrow disease -liver disease -an unusual or allergic reaction to methimazole, other medicines, foods, dyes, or preservatives -pregnant or trying to get pregnant -breast-feeding How should I use this medicine? Take this medicine by mouth with a glass of water. Follow the directions on the prescription label. You can take this medicine with or without food. However, you should always take it the same way to make sure the effects are the same. Take your doses at regular intervals. Do not take your medicine more often than directed. Do not stop taking this medicine except on the advice of your doctor or health care professional. Talk to your pediatrician regarding the use of this medicine in children. Special care may be needed. While this drug may be prescribed for children for selected conditions, precautions do apply. Overdosage: If you think you have taken too much of this medicine contact a poison control center or emergency room at once. NOTE: This medicine is only for you. Do not share this medicine with others. What if I miss a dose? If you miss a dose, take it as soon as you can. If it is almost time for your next dose, take only that dose. Do  not take double or extra doses. What may interact with this medicine? Do not take this medicine with any of the following medications: -sodium iodide -thyroid hormones This medicine may also interact with the following medications: -certain medicines for high blood pressure like metoprolol and propranolol -digoxin -theophylline -warfarin This list may not describe all possible interactions. Give your health care provider a list of all the medicines, herbs, non-prescription drugs, or dietary supplements you use. Also tell them if you smoke, drink alcohol, or use illegal drugs. Some items may interact with your medicine. What should I watch for while using this medicine? Visit your doctor or health care professional for regular checks on your progress. Your thyroid hormone levels will need to be checked. This medicine can reduce your resistance to infection. Contact your doctor or health care professional if you have any infection or injury. Avoid people who have colds, flu, bronchitis or other infectious disease. Do not have any vaccinations without asking your doctor or health care professional. Avoid people who have recently received oral polio vaccine. What side effects may I notice from receiving this medicine? Side effects that you should report to your doctor or health care professional as soon as possible: -black, tarry stools -fever, sore throat, hoarseness -numbness or tingling in the hands or feet -severe redness or itching of the skin, or dry cracked skin -stomach pain -swelling of the feet or legs -unusual bleeding or bruising, pinpoint red spots on the skin -unusual or sudden weight increase -unusually weak or tired -yellowing of skin or eyes  Side effects that usually do not require medical attention (report to your doctor or health care professional if they continue or are bothersome): -headache -nausea, vomiting -mild skin rash, itching -muscle aches and pains This list  may not describe all possible side effects. Call your doctor for medical advice about side effects. You may report side effects to FDA at 1-800-FDA-1088. Where should I keep my medicine? Keep out of the reach of children. Store at room temperature between 15 and 30 degrees C (59 and 86 degrees F). Throw away any unused medicine after the expiration date. NOTE: This sheet is a summary. It may not cover all possible information. If you have questions about this medicine, talk to your doctor, pharmacist, or health care provider.  2018 Elsevier/Gold Standard (2008-03-01 15:59:43)     Influenza Virus Vaccine (Flucelvax) What is this medicine? INFLUENZA VIRUS VACCINE (in floo EN zuh VAHY ruhs vak SEEN) helps to reduce the risk of getting influenza also known as the flu. The vaccine only helps protect you against some strains of the flu. This medicine may be used for other purposes; ask your health care provider or pharmacist if you have questions. COMMON BRAND NAME(S): FLUCELVAX What should I tell my health care provider before I take this medicine? They need to know if you have any of these conditions: -bleeding disorder like hemophilia -fever or infection -Guillain-Barre syndrome or other neurological problems -immune system problems -infection with the human immunodeficiency virus (HIV) or AIDS -low blood platelet counts -multiple sclerosis -an unusual or allergic reaction to influenza virus vaccine, other medicines, foods, dyes or preservatives -pregnant or trying to get pregnant -breast-feeding How should I use this medicine? This vaccine is for injection into a muscle. It is given by a health care professional. A copy of Vaccine Information Statements will be given before each vaccination. Read this sheet carefully each time. The sheet may change frequently. Talk to your pediatrician regarding the use of this medicine in children. Special care may be needed. Overdosage: If you think  you've taken too much of this medicine contact a poison control center or emergency room at once. Overdosage: If you think you have taken too much of this medicine contact a poison control center or emergency room at once. NOTE: This medicine is only for you. Do not share this medicine with others. What if I miss a dose? This does not apply. What may interact with this medicine? -chemotherapy or radiation therapy -medicines that lower your immune system like etanercept, anakinra, infliximab, and adalimumab -medicines that treat or prevent blood clots like warfarin -phenytoin -steroid medicines like prednisone or cortisone -theophylline -vaccines This list may not describe all possible interactions. Give your health care provider a list of all the medicines, herbs, non-prescription drugs, or dietary supplements you use. Also tell them if you smoke, drink alcohol, or use illegal drugs. Some items may interact with your medicine. What should I watch for while using this medicine? Report any side effects that do not go away within 3 days to your doctor or health care professional. Call your health care provider if any unusual symptoms occur within 6 weeks of receiving this vaccine. You may still catch the flu, but the illness is not usually as bad. You cannot get the flu from the vaccine. The vaccine will not protect against colds or other illnesses that may cause fever. The vaccine is needed every year. What side effects may I notice from receiving this medicine? Side effects that you should  report to your doctor or health care professional as soon as possible: -allergic reactions like skin rash, itching or hives, swelling of the face, lips, or tongue Side effects that usually do not require medical attention (Report these to your doctor or health care professional if they continue or are bothersome.): -fever -headache -muscle aches and pains -pain, tenderness, redness, or swelling at the  injection site -tiredness This list may not describe all possible side effects. Call your doctor for medical advice about side effects. You may report side effects to FDA at 1-800-FDA-1088. Where should I keep my medicine? The vaccine will be given by a health care professional in a clinic, pharmacy, doctor's office, or other health care setting. You will not be given vaccine doses to store at home. NOTE: This sheet is a summary. It may not cover all possible information. If you have questions about this medicine, talk to your doctor, pharmacist, or health care provider.  2018 Elsevier/Gold Standard (2011-07-25 14:06:47)    If you have lab work done today you will be contacted with your lab results within the next 2 weeks.  If you have not heard from [place] then please contact us. The fastest way to get your results is to register for My Chart.   IF you received an x-ray today, you will receive an invoice from [facility]. Please contact [facility] at [phone] with questions or concerns regarding your invoice.   IF you received labwork today, you will receive an invoice from [facility]. Please contact [facility] at [phone] with questions or concerns regarding your invoice.   Our billing staff will not be able to assist you with questions regarding bills from these companies.  You will be contacted with the lab results as soon as they are available. The fastest way to get your results is to activate your My Chart account. Instructions are located on the last page of this paperwork. If you have not heard from [place] regarding the results in 2 weeks, please contact this office.

## 2018-05-08 NOTE — Progress Notes (Addendum)
    MRN: [id number] DOB: 1981/01/05  Subjective:   Logan Meadows is a 37 y.o. male presenting for recheck on his hyperthyroidism.  Last office visit was 04/09/2018, had his TSH, free T3 and free T4 check.  All were trending toward the low end.  He subsequently had his methimazole decreased down to 5 mg twice daily.  Today he reports that he went to the endocrinologist on the same day of his last office visit. He was advised that 10mg  of methimazole once daily and 5mg  in the evening was appropriate for him. Admits that he has had some constipation, weight gain. He is on fluid restrictions from his cardiologist. Denies depression, fatigue. Has follow up with endocrinology in 1 week.   reports that he quit smoking about 3 months ago. He smoked 0.25 packs per day. He has never used smokeless tobacco. He reports that he drank about 12.0 standard drinks of alcohol per week. He reports that he has current or past drug history. Drugs: Marijuana and Cocaine. Frequency: 7.00 times per week.   Logan Meadows has a current medication list which includes the following prescription(s): acetaminophen, apixaban, carvedilol, cetirizine, fluticasone, methimazole, spironolactone, and losartan. Also has No Known Allergies.  Logan Meadows  has a past medical history of Abnormal TSH (01/10/2018), Alcohol use, Atrial flutter (HCC) (01/10/2018), Cardiomyopathy (HCC), Cocaine use, Graves disease, Hyperthyroidism, Marijuana use, and Seasonal allergies.   Objective:   Vitals: BP 122/75   Pulse 62   Temp 98 F (36.7 C) (Oral)   Resp 18   Ht 6\' 2"  (1.88 m)   Wt 246 lb 9.6 oz (111.9 kg)   SpO2 98%   BMI 31.66 kg/m   Wt Readings from Last 3 Encounters:  05/08/18 246 lb 9.6 oz (111.9 kg)  04/09/18 238 lb 6.4 oz (108.1 kg)  04/09/18 239 lb 3.2 oz (108.5 kg)   BP Readings from Last 3 Encounters:  05/08/18 122/75  04/09/18 124/78  04/09/18 134/76    Physical Exam  Cardiovascular: Normal rate.  Psychiatric: He has a normal  mood and affect.   Assessment and Plan :   Hyperthyroidism - Plan: T3, free, T4, free, TSH  Weight gain  Flu vaccine need - Plan: Flu Vaccine QUAD 6+ mos PF IM (Fluarix Quad PF)  Pending labs will decrease to 5mg  twice daily. Continue follow up with endocrinology.  Wallis Bamberg, PA-C Primary [facility] at [facility] [phone] 05/08/2018  8:13 AM

## 2018-05-09 LAB — T3, FREE: T3, Free: 2.1 pg/mL (ref 2.0–4.4)

## 2018-05-09 LAB — TSH: TSH: 4.09 u[IU]/mL (ref 0.450–4.500)

## 2018-05-09 LAB — T4, FREE: Free T4: 0.49 ng/dL — ABNORMAL LOW (ref 0.82–1.77)

## 2018-05-15 ENCOUNTER — Encounter: Payer: Self-pay | Admitting: Endocrinology

## 2018-05-15 ENCOUNTER — Ambulatory Visit (INDEPENDENT_AMBULATORY_CARE_PROVIDER_SITE_OTHER): Payer: BLUE CROSS/BLUE SHIELD | Admitting: Endocrinology

## 2018-05-15 VITALS — BP 122/70 | HR 50 | Ht 74.0 in | Wt 250.2 lb

## 2018-05-15 DIAGNOSIS — E0590 Thyrotoxicosis, unspecified without thyrotoxic crisis or storm: Secondary | ICD-10-CM

## 2018-05-15 LAB — TSH: TSH: 4.21 u[IU]/mL (ref 0.35–4.50)

## 2018-05-15 LAB — T4, FREE: FREE T4: 0.59 ng/dL — AB (ref 0.60–1.60)

## 2018-05-15 NOTE — Progress Notes (Signed)
Subjective:    Patient ID: Logan Meadows, male    DOB: [birth year], 37 y.o.   MRN: [id number]  HPI Pt is referred by Wallis Bamberg, PA, for hyperthyroidism (dx'ed mid-2019, when he was in the hospital with AF, but he had also used cocaine just prior to admission; he was rx'ed tapazole and coreg; nuc med scan was c/w Grave's Dz).  pt states he feels better in general.  In particular, palpitations are much better.  no dizziness.  He takes tapazole as rx'ed.   Past Medical History:  Diagnosis Date  . Abnormal TSH 01/10/2018  . Alcohol use   . Atrial flutter (HCC) 01/10/2018   a. dx 12/2017 in setting of hyperthyroidism, cocaine, alcohol, THC use.  . Cardiomyopathy (HCC)    a. suspected tachy-mediated - EF 20-25% by echo and 30-35% by TEE 12/2017.  Marland Kitchen Cocaine use   . Graves disease   . Hyperthyroidism   . Marijuana use   . Seasonal allergies     Past Surgical History:  Procedure Laterality Date  . CARDIOVERSION N/A 01/17/2018   Procedure: CARDIOVERSION;  Surgeon: Wendall Stade, MD;  Location: [facility] ENDOSCOPY;  Service: Cardiovascular;  Laterality: N/A;  . None    . TEE WITHOUT CARDIOVERSION N/A 01/17/2018   Procedure: TRANSESOPHAGEAL ECHOCARDIOGRAM (TEE);  Surgeon: Wendall Stade, MD;  Location: [facility] ENDOSCOPY;  Service: Cardiovascular;  Laterality: N/A;    Social History   Socioeconomic History  . Marital status: Single    Spouse name: Not on file  . Number of children: Not on file  . Years of education: Not on file  . Highest education level: Not on file  Occupational History  . Occupation: Sports administrator person    Employer: [employer]  Social Needs  . Financial resource strain: Not on file  . Food insecurity:    Worry: Not on file    Inability: Not on file  . Transportation needs:    Medical: Not on file    Non-medical: Not on file  Tobacco Use  . Smoking status: Former Smoker    Packs/day: 0.25    Last attempt to quit: 01/19/2018    Years since quitting: 0.3  . Smokeless  tobacco: Never Used  . Tobacco comment: 1 pack on the weekends  Substance and Sexual Activity  . Alcohol use: Not Currently    Alcohol/week: 12.0 standard drinks    Types: 12 Cans of beer per week    Comment: 12 pack per weekend  . Drug use: Not Currently    Frequency: 7.0 times per week    Types: Marijuana, Cocaine    Comment: daily marijuana, cocaine on weekends  . Sexual activity: Not on file  Lifestyle  . Physical activity:    Days per week: Not on file    Minutes per session: Not on file  . Stress: Not on file  Relationships  . Social connections:    Talks on phone: Not on file    Gets together: Not on file    Attends religious service: Not on file    Active member of club or organization: Not on file    Attends meetings of clubs or organizations: Not on file    Relationship status: Not on file  . Intimate partner violence:    Fear of current or ex partner: Not on file    Emotionally abused: Not on file    Physically abused: Not on file    Forced sexual activity: Not on file  Other Topics Concern  . Not on file  Social History Narrative   Pt lives with his mother.     Current Outpatient Medications on File Prior to Visit  Medication Sig Dispense Refill  . acetaminophen (TYLENOL) 500 MG tablet Take 500 mg by mouth every 6 (six) hours as needed.    Marland Kitchen apixaban (ELIQUIS) 5 MG TABS tablet Take 1 tablet (5 mg total) by mouth 2 (two) times daily. 60 tablet 3  . carvedilol (COREG) 12.5 MG tablet Take 1 tablet (12.5 mg total) by mouth 2 (two) times daily with a meal. 180 tablet 3  . cetirizine (ZYRTEC) 10 MG tablet Take 10 mg by mouth daily.    . fluticasone (FLONASE) 50 MCG/ACT nasal spray Place 2 sprays into both nostrils daily as needed for allergies or rhinitis.    . methimazole (TAPAZOLE) 5 MG tablet Take 1 tablet (5 mg total) by mouth 2 (two) times daily. (Patient taking differently: Take 5 mg by mouth 2 (two) times daily. Takes one 10mg  once in the morning in addition.)  60 tablet 1  . spironolactone (ALDACTONE) 25 MG tablet Take 0.5 tablets (12.5 mg total) by mouth daily. 90 tablet 3  . losartan (COZAAR) 25 MG tablet Take 1 tablet (25 mg total) by mouth daily. 90 tablet 3   No current facility-administered medications on file prior to visit.     No Known Allergies  Family History  Problem Relation Age of Onset  . Diabetes Mother   . Diabetes Father   . Colon polyps Father   . Thyroid disease Neg Hx     BP 122/70   Pulse (!) 50   Ht 6\' 2"  (1.88 m)   Wt 250 lb 3.2 oz (113.5 kg)   SpO2 99%   BMI 32.12 kg/m   Review of Systems Denies fever.      Objective:   Physical Exam VITAL SIGNS:  See vs page GENERAL: no distress NECK: thyroid is 3 times normal size.  diffuse.     Lab Results  Component Value Date   TSH 4.21 05/15/2018   T4TOTAL 5.4 03/07/2018      Assessment & Plan:  Hyperthyroidism: well-controlled AF: rate is slightly overcontrolled.  Will f/u with cardiol  Patient Instructions  blood tests are requested for you today.  We'll let you know about the results. Based on the results, i'll send a new prescription for the methimazole. If ever you have fever while taking methimazole, stop it and call us, even if the reason is obvious, because of the risk of a rare side-effect.  Please come back for a follow-up appointment in 6 weeks.    When the thyroid is better, you could consider taking the radioactive iodine pull.  It works like this:  We would first check a thyroid "scan" (a special, but easy and painless type of thyroid x ray).  you go to the x-ray department of the hospital to swallow a pill, which contains a miniscule amount of radiation.  You will not notice any symptoms from this.  You will go back to the x-ray department the next day, to lie down in front of a camera.  The results of this will be sent to me.   Based on the results, i hope to order for you a treatment pill of radioactive iodine.  Although it is a larger  amount of radiation, you will again notice no symptoms from this.  The pill is gone from your body in a  few days (during which you should stay away from other people), but takes several months to work.  Therefore, please return here approximately 6-8 weeks after the treatment.  This treatment has been available for many years, and the only known side-effect is an underactive thyroid.  It is possible that i would eventually prescribe for you a thyroid hormone pill, which is very inexpensive.  You don't have to worry about side-effects of this thyroid hormone pill, because it is the same molecule your thyroid makes.

## 2018-05-15 NOTE — Patient Instructions (Signed)
blood tests are requested for you today.  We'll let you know about the results. Based on the results, i'll send a new prescription for the methimazole. If ever you have fever while taking methimazole, stop it and call us, even if the reason is obvious, because of the risk of a rare side-effect.  Please come back for a follow-up appointment in 6 weeks.    When the thyroid is better, you could consider taking the radioactive iodine pull.  It works like this:  We would first check a thyroid "scan" (a special, but easy and painless type of thyroid x ray).  you go to the x-ray department of the hospital to swallow a pill, which contains a miniscule amount of radiation.  You will not notice any symptoms from this.  You will go back to the x-ray department the next day, to lie down in front of a camera.  The results of this will be sent to me.   Based on the results, i hope to order for you a treatment pill of radioactive iodine.  Although it is a larger amount of radiation, you will again notice no symptoms from this.  The pill is gone from your body in a few days (during which you should stay away from other people), but takes several months to work.  Therefore, please return here approximately 6-8 weeks after the treatment.  This treatment has been available for many years, and the only known side-effect is an underactive thyroid.  It is possible that i would eventually prescribe for you a thyroid hormone pill, which is very inexpensive.  You don't have to worry about side-effects of this thyroid hormone pill, because it is the same molecule your thyroid makes.

## 2018-05-19 ENCOUNTER — Telehealth: Payer: Self-pay

## 2018-05-19 NOTE — Telephone Encounter (Signed)
Patient returning call please call back

## 2018-05-20 NOTE — Telephone Encounter (Signed)
Returned pt call and lft message to return call please see result note, need pt to verify whether he is taking medication

## 2018-05-26 ENCOUNTER — Telehealth: Payer: Self-pay | Admitting: Endocrinology

## 2018-05-26 ENCOUNTER — Other Ambulatory Visit: Payer: Self-pay | Admitting: Urgent Care

## 2018-05-26 NOTE — Telephone Encounter (Signed)
Please advise on refills.  

## 2018-05-26 NOTE — Telephone Encounter (Signed)
Rx refill request:  apixaban 5 mg           Last ordered: 01/19/18  Outside provider                             Methimazole 5 mg     Last filled: 04/10/18    Former [facility] patient- has not established with another                                                                                                                  provider   LOV: 05/08/18 lab(05/15/18- normal)  PCP: none- former [employer]: verified

## 2018-05-26 NOTE — Telephone Encounter (Signed)
Copied from CRM [id number]. Topic: General - Other >> May 26, 2018  2:15 PM Ronney Lion A wrote: Medication:  methimazole (TAPAZOLE) 5 MG tablet, and apixaban (ELIQUIS) 5 MG TABS tablet   Has the patient contacted their pharmacy?Yes   Preferred Pharmacy (with phone number or street name): [id number] - [place], [place] - [address] AT [facility] [address] [facility] [address]  [phone] (Phone) [phone] (Fax)    Agent: Please be advised that RX refills may take up to 3 business days. We ask that you follow-up with your pharmacy.

## 2018-05-26 NOTE — Telephone Encounter (Signed)
rx was not written by Everardo All informed pt to contact [facility] for a refill

## 2018-05-26 NOTE — Telephone Encounter (Signed)
Methimazole refill is needed patient did call the pharmacy but they told him to call us [facility] on [address] # [phone]

## 2018-06-02 ENCOUNTER — Other Ambulatory Visit: Payer: Self-pay | Admitting: Urgent Care

## 2018-06-02 NOTE — Telephone Encounter (Signed)
Pt called in to check on the status of the Medication:  methimazole (TAPAZOLE)  And the apixaban (ELIQUIS) 5 MG TABS tablet [[id number]]   Pharmacy - [facility] on [address]  Best call back number  [phone]

## 2018-06-05 ENCOUNTER — Ambulatory Visit: Payer: BLUE CROSS/BLUE SHIELD | Admitting: Urgent Care

## 2018-06-17 MED ORDER — APIXABAN 5 MG PO TABS: 5.0000 mg | ORAL_TABLET | Freq: Two times a day (BID) | ORAL | 1 refills | Status: DC

## 2018-06-19 NOTE — Telephone Encounter (Signed)
done

## 2018-06-25 ENCOUNTER — Ambulatory Visit (HOSPITAL_COMMUNITY): Payer: BLUE CROSS/BLUE SHIELD | Attending: Cardiovascular Disease

## 2018-06-25 ENCOUNTER — Encounter (INDEPENDENT_AMBULATORY_CARE_PROVIDER_SITE_OTHER): Payer: Self-pay

## 2018-06-25 ENCOUNTER — Other Ambulatory Visit: Payer: Self-pay

## 2018-06-25 DIAGNOSIS — I429 Cardiomyopathy, unspecified: Secondary | ICD-10-CM

## 2018-07-01 ENCOUNTER — Ambulatory Visit (INDEPENDENT_AMBULATORY_CARE_PROVIDER_SITE_OTHER): Payer: BLUE CROSS/BLUE SHIELD | Admitting: Endocrinology

## 2018-07-01 ENCOUNTER — Encounter: Payer: Self-pay | Admitting: Endocrinology

## 2018-07-01 ENCOUNTER — Telehealth: Payer: Self-pay | Admitting: Endocrinology

## 2018-07-01 VITALS — BP 124/70 | HR 65 | Ht 74.0 in | Wt 252.6 lb

## 2018-07-01 DIAGNOSIS — E0590 Thyrotoxicosis, unspecified without thyrotoxic crisis or storm: Secondary | ICD-10-CM | POA: Diagnosis not present

## 2018-07-01 LAB — T4, FREE: Free T4: 0.68 ng/dL (ref 0.60–1.60)

## 2018-07-01 LAB — TSH: TSH: 3.77 u[IU]/mL (ref 0.35–4.50)

## 2018-07-01 MED ORDER — METHIMAZOLE 5 MG PO TABS: 5.0000 mg | ORAL_TABLET | Freq: Two times a day (BID) | ORAL | 1 refills | Status: DC

## 2018-07-01 NOTE — Telephone Encounter (Signed)
Patient returning call from the office about labs

## 2018-07-01 NOTE — Telephone Encounter (Signed)
Returned pt call. Informed of lab results. Verbalized acceptance and understanding.

## 2018-07-01 NOTE — Patient Instructions (Addendum)
blood tests are requested for you today.  We'll let you know about the results.  Please stop taking the methimazole. I have ordered for you the treatment pill of radioactive iodine.  Although it is a larger amount of radiation than before, you will again notice no symptoms from this.  The pill is gone from your body in a few days (during which you should stay away from other people), but takes several months to work.  Therefore, please return here approximately 6-8 weeks after the treatment.  This treatment has been available for many years, and the only known side-effect is an underactive thyroid.  It is possible that i would eventually prescribe for you a thyroid hormone pill, which is very inexpensive.  You don't have to worry about side-effects of this thyroid hormone pill, because it is the same molecule your thyroid makes.

## 2018-07-01 NOTE — Progress Notes (Signed)
Subjective:    Patient ID: Logan Meadows, male    DOB: [birth year], 37 y.o.   MRN: [id number]  HPI Pt returns for f/u of hyperthyroidism (dx'ed mid-2019, when he was in the hospital with AF, but he had also used cocaine just prior to admission; he was rx'ed tapazole and coreg; nuc med scan was c/w Grave's Dz).  pt states he feels well in general. He takes tapazole as rx'ed.   Past Medical History:  Diagnosis Date  . Abnormal TSH 01/10/2018  . Alcohol use   . Atrial flutter (HCC) 01/10/2018   a. dx 12/2017 in setting of hyperthyroidism, cocaine, alcohol, THC use.  . Cardiomyopathy (HCC)    a. suspected tachy-mediated - EF 20-25% by echo and 30-35% by TEE 12/2017.  Marland Kitchen Cocaine use   . Graves disease   . Hyperthyroidism   . Marijuana use   . Seasonal allergies     Past Surgical History:  Procedure Laterality Date  . CARDIOVERSION N/A 01/17/2018   Procedure: CARDIOVERSION;  Surgeon: Wendall Stade, MD;  Location: [facility] ENDOSCOPY;  Service: Cardiovascular;  Laterality: N/A;  . None    . TEE WITHOUT CARDIOVERSION N/A 01/17/2018   Procedure: TRANSESOPHAGEAL ECHOCARDIOGRAM (TEE);  Surgeon: Wendall Stade, MD;  Location: [facility] ENDOSCOPY;  Service: Cardiovascular;  Laterality: N/A;    Social History   Socioeconomic History  . Marital status: Single    Spouse name: Not on file  . Number of children: Not on file  . Years of education: Not on file  . Highest education level: Not on file  Occupational History  . Occupation: Sports administrator person    Employer: [employer]  Social Needs  . Financial resource strain: Not on file  . Food insecurity:    Worry: Not on file    Inability: Not on file  . Transportation needs:    Medical: Not on file    Non-medical: Not on file  Tobacco Use  . Smoking status: Former Smoker    Packs/day: 0.25    Last attempt to quit: 01/19/2018    Years since quitting: 0.4  . Smokeless tobacco: Never Used  . Tobacco comment: 1 pack on the weekends  Substance and Sexual  Activity  . Alcohol use: Not Currently    Alcohol/week: 12.0 standard drinks    Types: 12 Cans of beer per week    Comment: 12 pack per weekend  . Drug use: Not Currently    Frequency: 7.0 times per week    Types: Marijuana, Cocaine    Comment: daily marijuana, cocaine on weekends  . Sexual activity: Not on file  Lifestyle  . Physical activity:    Days per week: Not on file    Minutes per session: Not on file  . Stress: Not on file  Relationships  . Social connections:    Talks on phone: Not on file    Gets together: Not on file    Attends religious service: Not on file    Active member of club or organization: Not on file    Attends meetings of clubs or organizations: Not on file    Relationship status: Not on file  . Intimate partner violence:    Fear of current or ex partner: Not on file    Emotionally abused: Not on file    Physically abused: Not on file    Forced sexual activity: Not on file  Other Topics Concern  . Not on file  Social History Narrative  Pt lives with his mother.     Current Outpatient Medications on File Prior to Visit  Medication Sig Dispense Refill  . acetaminophen (TYLENOL) 500 MG tablet Take 500 mg by mouth every 6 (six) hours as needed.    Marland Kitchen apixaban (ELIQUIS) 5 MG TABS tablet Take 1 tablet (5 mg total) by mouth 2 (two) times daily. 180 tablet 1  . carvedilol (COREG) 12.5 MG tablet Take 1 tablet (12.5 mg total) by mouth 2 (two) times daily with a meal. 180 tablet 3  . cetirizine (ZYRTEC) 10 MG tablet Take 10 mg by mouth daily.    . fluticasone (FLONASE) 50 MCG/ACT nasal spray Place 2 sprays into both nostrils daily as needed for allergies or rhinitis.    Marland Kitchen spironolactone (ALDACTONE) 25 MG tablet Take 0.5 tablets (12.5 mg total) by mouth daily. 90 tablet 3  . losartan (COZAAR) 25 MG tablet Take 1 tablet (25 mg total) by mouth daily. 90 tablet 3   No current facility-administered medications on file prior to visit.     No Known  Allergies  Family History  Problem Relation Age of Onset  . Diabetes Mother   . Diabetes Father   . Colon polyps Father   . Thyroid disease Neg Hx     BP 124/70 (BP Location: Left Arm, Patient Position: Sitting, Cuff Size: Large)   Pulse 65   Ht 6\' 2"  (1.88 m)   Wt 252 lb 9.6 oz (114.6 kg)   SpO2 95%   BMI 32.43 kg/m    Review of Systems Denies fever.     Objective:   Physical Exam VITAL SIGNS:  See vs page GENERAL: no distress NECK: thyroid is 3 times normal size.  diffuse.       Assessment & Plan:  Hyperthyroidism.  We discussed rx options.  He requests RAI.  OK with me.   Patient Instructions  blood tests are requested for you today.  We'll let you know about the results.  Please stop taking the methimazole. I have ordered for you the treatment pill of radioactive iodine.  Although it is a larger amount of radiation than before, you will again notice no symptoms from this.  The pill is gone from your body in a few days (during which you should stay away from other people), but takes several months to work.  Therefore, please return here approximately 6-8 weeks after the treatment.  This treatment has been available for many years, and the only known side-effect is an underactive thyroid.  It is possible that i would eventually prescribe for you a thyroid hormone pill, which is very inexpensive.  You don't have to worry about side-effects of this thyroid hormone pill, because it is the same molecule your thyroid makes.

## 2018-07-02 NOTE — Progress Notes (Signed)
Cardiology Office Note   Date:  07/02/2018   ID:  Logan Meadows, DOB 04/25/1981, MRN [id number]  PCP:  Patient, No Pcp Per  Cardiologist:  Dr. Eden Emms    No chief complaint on file.     History of Present Illness: Logan Meadows is a 37 y.o. male who presents for a fib, cardiomyopathy and hyperthyroid. He has a history of substance abuse THC, cocaine and smoking First seen [facility] EF May 2019 with EF 25-30% rapid afib and very hyperthyroid. TEE /  [facility] done due to hard to control rate and CHF. EF 30-35% on TEE Placed on beta blocker and methimazole Last Free T4 05/15/18 .68 normal range TSH also normal at 3.77 . Dr Everardo All wanted him to stopMethimazole and take radioactive iodine pill   F/U TTE done 06/25/18 reviewed EF 55-60% mild LAE   Feels better no cardiac symptoms Getting more iodine in December. BP been low    Past Medical History:  Diagnosis Date  . Abnormal TSH 01/10/2018  . Alcohol use   . Atrial flutter (HCC) 01/10/2018   a. dx 12/2017 in setting of hyperthyroidism, cocaine, alcohol, THC use.  . Cardiomyopathy (HCC)    a. suspected tachy-mediated - EF 20-25% by echo and 30-35% by TEE 12/2017.  Marland Kitchen Cocaine use   . Graves disease   . Hyperthyroidism   . Marijuana use   . Seasonal allergies     Past Surgical History:  Procedure Laterality Date  . CARDIOVERSION N/A 01/17/2018   Procedure: CARDIOVERSION;  Surgeon: Wendall Stade, MD;  Location: [facility] ENDOSCOPY;  Service: Cardiovascular;  Laterality: N/A;  . None    . TEE WITHOUT CARDIOVERSION N/A 01/17/2018   Procedure: TRANSESOPHAGEAL ECHOCARDIOGRAM (TEE);  Surgeon: Wendall Stade, MD;  Location: [facility] ENDOSCOPY;  Service: Cardiovascular;  Laterality: N/A;     Current Outpatient Medications  Medication Sig Dispense Refill  . acetaminophen (TYLENOL) 500 MG tablet Take 500 mg by mouth every 6 (six) hours as needed.    Marland Kitchen apixaban (ELIQUIS) 5 MG TABS tablet Take 1 tablet (5 mg total) by mouth 2 (two) times daily. 180  tablet 1  . carvedilol (COREG) 12.5 MG tablet Take 1 tablet (12.5 mg total) by mouth 2 (two) times daily with a meal. 180 tablet 3  . cetirizine (ZYRTEC) 10 MG tablet Take 10 mg by mouth daily.    . fluticasone (FLONASE) 50 MCG/ACT nasal spray Place 2 sprays into both nostrils daily as needed for allergies or rhinitis.    Marland Kitchen losartan (COZAAR) 25 MG tablet Take 1 tablet (25 mg total) by mouth daily. 90 tablet 3  . spironolactone (ALDACTONE) 25 MG tablet Take 0.5 tablets (12.5 mg total) by mouth daily. 90 tablet 3   No current facility-administered medications for this visit.     Allergies:   Patient has no known allergies.    Social History:  The patient  reports that he quit smoking about 5 months ago. He smoked 0.25 packs per day. He has never used smokeless tobacco. He reports that he drank about 12.0 standard drinks of alcohol per week. He reports that he has current or past drug history. Drugs: Marijuana and Cocaine. Frequency: 7.00 times per week.   Family History:  The patient's family history includes Colon polyps in his father; Diabetes in his father and mother.    ROS:  General:no colds or fevers, mild weight increase Skin:no rashes or ulcers HEENT:no blurred vision, no congestion CV:see HPI PUL:see HPI GI:no  diarrhea constipation or melena, no indigestion GU:no hematuria, no dysuria MS:no joint pain, no claudication Neuro:no syncope, no lightheadedness Endo:no diabetes, no thyroid disease  Wt Readings from Last 3 Encounters:  07/01/18 252 lb 9.6 oz (114.6 kg)  05/15/18 250 lb 3.2 oz (113.5 kg)  05/08/18 246 lb 9.6 oz (111.9 kg)     PHYSICAL EXAM: VS:  There were no vitals taken for this visit. , BMI There is no height or weight on file to calculate BMI. Affect appropriate Healthy:  appears stated age HEENT: normal Neck supple with no adenopathy JVP normal no bruits no thyromegaly Lungs clear with no wheezing and good diaphragmatic motion Heart:  S1/S2 no murmur, no  rub, gallop or click PMI normal Abdomen: benighn, BS positve, no tenderness, no AAA no bruit.  No HSM or HJR Distal pulses intact with no bruits No edema Neuro non-focal Skin warm and dry No muscular weakness     EKG:   01/29/18 SR rate 61 normal     Recent Labs: 01/11/2018: ALT 27 01/13/2018: B Natriuretic Peptide 170.8 01/16/2018: Magnesium 1.9 01/29/2018: Hemoglobin 14.6; Platelets 230 03/07/2018: BUN 11; Creatinine, Ser 0.77; Potassium 3.9; Sodium 143 07/01/2018: TSH 3.77    Lipid Panel    Component Value Date/Time   CHOL 108 01/12/2018 0319   TRIG 47 01/12/2018 0319   HDL 32 (L) 01/12/2018 0319   CHOLHDL 3.4 01/12/2018 0319   VLDL 9 01/12/2018 0319   LDLCALC 67 01/12/2018 0319       Other studies Reviewed: Additional studies/ records that were reviewed today include: .  TEE  01/17/18 Study Conclusions  - Left ventricle: Diffuse hypo kinesis likely tachycardia induced   DCM given rapid flutter and hyperthyroidism. Systolic function   was severely reduced. The estimated ejection fraction was in the   range of 25% to 30%. - Aortic valve: No evidence of vegetation. - Mitral valve: There was mild regurgitation. - Left atrium: The atrium was moderately dilated. No evidence of   thrombus in the atrial cavity or appendage. - Right ventricle: The cavity size was mildly dilated. - Right atrium: The atrium was dilated. - Atrial septum: No defect or patent foramen ovale was identified. - Pulmonic valve: No evidence of vegetation. - Impressions: No LAA thrombus   Anticoagulated     DCC x 1 200J converted from rapid atrial flutter rate 160 to NSR   rate 98   given 5 mg iv Lopressor to blunt adrenergic tone.     No immediate neurologic sequelae  Impressions:  - No LAA thrombus    Anticoagulated  ASSESSMENT AND PLAN:  1.  Atrial flutter most likely due to hyperthyroidism with substance abuse.  Since TEE DCCV pt is maintaining SR  CHADVASC now 0 as EF improved  to normal  Since he is still getting radioactive iodine Rx will wait to stop anticoagulation until done with Rx possibly in January   2.  Cardiomyopathy - resolved related to hyperthyroidism and rapid afib d/c cozaar and aldactone  3.  Hyperthyroidism euthryoid f/u Dr Everardo All   4.  Polysubstance abuse, no longer using any substance.    Disposition:   FU: me in  January   Signed, Dominique Calvey, MD  07/02/2018 10:45 AM    [facility] [address] [place], [place], [place]  [address]/ [address] [place], [place] Phone: [phone]; Fax: [phone]  [phone]

## 2018-07-10 ENCOUNTER — Ambulatory Visit (INDEPENDENT_AMBULATORY_CARE_PROVIDER_SITE_OTHER): Payer: BLUE CROSS/BLUE SHIELD | Admitting: Cardiovascular Disease

## 2018-07-10 ENCOUNTER — Encounter: Payer: Self-pay | Admitting: Cardiovascular Disease

## 2018-07-10 VITALS — BP 106/68 | HR 62 | Ht 74.0 in | Wt 251.2 lb

## 2018-07-10 DIAGNOSIS — I4892 Unspecified atrial flutter: Secondary | ICD-10-CM | POA: Diagnosis not present

## 2018-07-10 DIAGNOSIS — I429 Cardiomyopathy, unspecified: Secondary | ICD-10-CM | POA: Diagnosis not present

## 2018-07-10 NOTE — Patient Instructions (Addendum)
Medication Instructions:  Your physician has recommended you make the following change in your medication:  1-STOP Losartan (Cozaar) 2-STOP Spironolactone (Aldactone)   If you need a refill on your cardiac medications before your next appointment, please call your pharmacy.   Lab work:  If you have labs (blood work) drawn today and your tests are completely normal, you will receive your results only by: Marland Kitchen MyChart Message (if you have MyChart) OR . A paper copy in the mail If you have any lab test that is abnormal or we need to change your treatment, we will call you to review the results.  Testing/Procedures: NONE ordered at this time.   Follow-Up: At [facility], you and your health needs are our priority.  As part of our continuing mission to provide you with exceptional heart care, we have created designated Provider Care Teams.  These Care Teams include your primary Cardiologist (physician) and Advanced Practice Providers (APPs -  Physician Assistants and Nurse Practitioners) who all work together to provide you with the care you need, when you need it. Your physician recommends that you schedule a follow-up appointment in: January with Dr. Eden Emms.

## 2018-07-29 ENCOUNTER — Encounter (HOSPITAL_COMMUNITY)
Admission: RE | Admit: 2018-07-29 | Discharge: 2018-07-29 | Disposition: A | Discharge status: Home / Self Care | Payer: BLUE CROSS/BLUE SHIELD | Source: Ambulatory Visit | Attending: Endocrinology | Admitting: Endocrinology

## 2018-07-29 DIAGNOSIS — E0590 Thyrotoxicosis, unspecified without thyrotoxic crisis or storm: Secondary | ICD-10-CM | POA: Diagnosis present

## 2018-07-29 MED ORDER — SODIUM IODIDE I 131 CAPSULE
13.0900 | Freq: Once | INTRAVENOUS | Status: AC | PRN
  Administered 2018-07-29: 13.09 via ORAL

## 2018-09-02 ENCOUNTER — Ambulatory Visit (INDEPENDENT_AMBULATORY_CARE_PROVIDER_SITE_OTHER): Payer: Managed Care, Other (non HMO) | Admitting: Endocrinology

## 2018-09-02 ENCOUNTER — Encounter: Payer: Self-pay | Admitting: Endocrinology

## 2018-09-02 VITALS — BP 128/80 | HR 62 | Ht 74.0 in | Wt 260.8 lb

## 2018-09-02 DIAGNOSIS — E0590 Thyrotoxicosis, unspecified without thyrotoxic crisis or storm: Secondary | ICD-10-CM | POA: Diagnosis not present

## 2018-09-02 LAB — TSH: TSH: 0.1 u[IU]/mL — AB (ref 0.35–4.50)

## 2018-09-02 LAB — T4, FREE: Free T4: 1.18 ng/dL (ref 0.60–1.60)

## 2018-09-02 NOTE — Progress Notes (Signed)
Subjective:    Patient ID: Logan Meadows, male    DOB: Jan 16, 1981, 38 y.o.   MRN: [id number]  HPI Pt returns for f/u of hyperthyroidism (dx'ed mid-2019, when he was in the hospital with AF, but he had also used cocaine just prior to admission; he was rx'ed tapazole and coreg; nuc med scan was c/w Grave's Dz).  pt states he feels well in general. He took RAI 5 weeks ago.   Past Medical History:  Diagnosis Date  . Abnormal TSH 01/10/2018  . Alcohol use   . Atrial flutter (HCC) 01/10/2018   a. dx 12/2017 in setting of hyperthyroidism, cocaine, alcohol, THC use.  . Cardiomyopathy (HCC)    a. suspected tachy-mediated - EF 20-25% by echo and 30-35% by TEE 12/2017.  Marland Kitchen Cocaine use   . Graves disease   . Hyperthyroidism   . Marijuana use   . Seasonal allergies     Past Surgical History:  Procedure Laterality Date  . CARDIOVERSION N/A 01/17/2018   Procedure: CARDIOVERSION;  Surgeon: Wendall Stade, MD;  Location: [facility] ENDOSCOPY;  Service: Cardiovascular;  Laterality: N/A;  . None    . TEE WITHOUT CARDIOVERSION N/A 01/17/2018   Procedure: TRANSESOPHAGEAL ECHOCARDIOGRAM (TEE);  Surgeon: Wendall Stade, MD;  Location: [facility] ENDOSCOPY;  Service: Cardiovascular;  Laterality: N/A;    Social History   Socioeconomic History  . Marital status: Single    Spouse name: Not on file  . Number of children: Not on file  . Years of education: Not on file  . Highest education level: Not on file  Occupational History  . Occupation: Sports administrator person    Employer: [employer]  Social Needs  . Financial resource strain: Not on file  . Food insecurity:    Worry: Not on file    Inability: Not on file  . Transportation needs:    Medical: Not on file    Non-medical: Not on file  Tobacco Use  . Smoking status: Former Smoker    Packs/day: 0.25    Last attempt to quit: 01/19/2018    Years since quitting: 0.6  . Smokeless tobacco: Never Used  . Tobacco comment: 1 pack on the weekends  Substance and Sexual  Activity  . Alcohol use: Not Currently    Alcohol/week: 12.0 standard drinks    Types: 12 Cans of beer per week    Comment: 12 pack per weekend  . Drug use: Not Currently    Frequency: 7.0 times per week    Types: Marijuana, Cocaine    Comment: daily marijuana, cocaine on weekends  . Sexual activity: Not on file  Lifestyle  . Physical activity:    Days per week: Not on file    Minutes per session: Not on file  . Stress: Not on file  Relationships  . Social connections:    Talks on phone: Not on file    Gets together: Not on file    Attends religious service: Not on file    Active member of club or organization: Not on file    Attends meetings of clubs or organizations: Not on file    Relationship status: Not on file  . Intimate partner violence:    Fear of current or ex partner: Not on file    Emotionally abused: Not on file    Physically abused: Not on file    Forced sexual activity: Not on file  Other Topics Concern  . Not on file  Social History Narrative  Pt lives with his mother.     Current Outpatient Medications on File Prior to Visit  Medication Sig Dispense Refill  . acetaminophen (TYLENOL) 500 MG tablet Take 500 mg by mouth every 6 (six) hours as needed.    Marland Kitchen apixaban (ELIQUIS) 5 MG TABS tablet Take 1 tablet (5 mg total) by mouth 2 (two) times daily. 180 tablet 1  . carvedilol (COREG) 12.5 MG tablet Take 1 tablet (12.5 mg total) by mouth 2 (two) times daily with a meal. 180 tablet 3  . cetirizine (ZYRTEC) 10 MG tablet Take 10 mg by mouth daily.    . fluticasone (FLONASE) 50 MCG/ACT nasal spray Place 2 sprays into both nostrils daily as needed for allergies or rhinitis.     No current facility-administered medications on file prior to visit.     No Known Allergies  Family History  Problem Relation Age of Onset  . Diabetes Mother   . Diabetes Father   . Colon polyps Father   . Thyroid disease Neg Hx     BP 128/80 (BP Location: Left Arm, Patient  Position: Sitting, Cuff Size: Normal)   Pulse 62   Ht 6\' 2"  (1.88 m)   Wt 260 lb 12.8 oz (118.3 kg)   SpO2 98%   BMI 33.48 kg/m    Review of Systems Denies fever.     Objective:   Physical Exam VITAL SIGNS:  See vs page GENERAL: no distress NECK: There is no palpable thyroid enlargement.  No thyroid nodule is palpable.  No palpable lymphadenopathy at the anterior neck.  Lab Results  Component Value Date   TSH 0.10 (L) 09/02/2018   T4TOTAL 5.4 03/07/2018      Assessment & Plan:  Hyperthyroidism: slightly worse off tapazole.  However, RAI will work soon.   Patient Instructions  blood tests are requested for you today.  We'll let you know about the results.  Please come back for a follow-up appointment in 1 month.

## 2018-09-02 NOTE — Patient Instructions (Signed)
blood tests are requested for you today.  We'll let you know about the results.  Please come back for a follow-up appointment in 1 month.  

## 2018-09-08 NOTE — Progress Notes (Signed)
Cardiology Office Note   Date:  09/09/2018   ID:  Logan MonteLabaron D Cousineau, DOB 01/10/1981, MRN [id number]  PCP:  Wallis BambergMani, Mario, PA-C  Cardiologist:  Dr. Eden EmmsNishan    No chief complaint on file.     History of Present Illness: Logan Meadows is a 38 y.o. male who presents for a fib, cardiomyopathy and hyperthyroid. He has a history of substance abuse THC, cocaine and smoking First seen [facility] EF May 2019 with EF 25-30% rapid afib and very hyperthyroid. [facility] to control rate and CHF. EF 30-35% on TEE Placed on beta blocker and methimazole Last Free T4 05/15/18 .68 normal range TSH also normal at 3.77 . Dr Everardo AllEllison wanted him to stopMethimazole and take radioactive iodine pill   F/U TTE done 06/25/18 reviewed EF 55-60% mild LAE   Feels better no cardiac symptoms Reviewed note by Dr Everardo AllEllison 09/02/18  And indicated worse off Tapazole but RAI "will work soon"  TSH .10 compared To 3.77 2 months ago when on Tapazole   Discussed continuing coreg / eliquis until TSH normal off tapazole Then weaning coreg to daily for a couple weeks   Past Medical History:  Diagnosis Date  . Abnormal TSH 01/10/2018  . Alcohol use   . Atrial flutter (HCC) 01/10/2018   a. dx 12/2017 in setting of hyperthyroidism, cocaine, alcohol, THC use.  . Cardiomyopathy (HCC)    a. suspected tachy-mediated - EF 20-25% by echo and 30-35% by TEE 12/2017.  Marland Kitchen. Cocaine use   . Graves disease   . Hyperthyroidism   . Marijuana use   . Seasonal allergies     Past Surgical History:  Procedure Laterality Date  . CARDIOVERSION N/A 01/17/2018   Procedure: CARDIOVERSION;  Surgeon: Wendall StadeNishan, Lailany Enoch C, MD;  [facility];  Service: Cardiovascular;  Laterality: N/A;  . None    . TEE WITHOUT CARDIOVERSION N/A 01/17/2018   Procedure: TRANSESOPHAGEAL ECHOCARDIOGRAM (TEE);  Surgeon: Wendall StadeNishan, Avyay Coger C, MD;  [facility];  Service: Cardiovascular;  Laterality: N/A;     Current Outpatient Medications    Medication Sig Dispense Refill  . acetaminophen (TYLENOL) 500 MG tablet Take 500 mg by mouth every 6 (six) hours as needed.    Marland Kitchen. apixaban (ELIQUIS) 5 MG TABS tablet Take 1 tablet (5 mg total) by mouth 2 (two) times daily. 180 tablet 1  . carvedilol (COREG) 12.5 MG tablet Take 1 tablet (12.5 mg total) by mouth 2 (two) times daily with a meal. 180 tablet 3  . cetirizine (ZYRTEC) 10 MG tablet Take 10 mg by mouth daily.    . fluticasone (FLONASE) 50 MCG/ACT nasal spray Place 2 sprays into both nostrils daily as needed for allergies or rhinitis.     No current facility-administered medications for this visit.     Allergies:   Patient has no known allergies.    Social History:  The patient  reports that he quit smoking about 7 months ago. He smoked 0.25 packs per day. He has never used smokeless tobacco. He reports previous alcohol use of about 12.0 standard drinks of alcohol per week. He reports previous drug use. Frequency: 7.00 times per week. Drugs: Marijuana and Cocaine.   Family History:  The patient's family history includes Colon polyps in his father; Diabetes in his father and mother.    ROS:  General:no colds or fevers, mild weight increase Skin:no rashes or ulcers HEENT:no blurred vision, no congestion CV:see HPI PUL:see HPI GI:no diarrhea  constipation or melena, no indigestion GU:no hematuria, no dysuria MS:no joint pain, no claudication Neuro:no syncope, no lightheadedness Endo:no diabetes, no thyroid disease  Wt Readings from Last 3 Encounters:  09/09/18 261 lb 1.9 oz (118.4 kg)  09/02/18 260 lb 12.8 oz (118.3 kg)  07/10/18 251 lb 4 oz (114 kg)     PHYSICAL EXAM: VS:  BP 126/84   Pulse (!) 59   Ht 6\' 2"  (1.88 m)   Wt 261 lb 1.9 oz (118.4 kg)   SpO2 98%   BMI 33.53 kg/m  , BMI Body mass index is 33.53 kg/m. Affect appropriate Healthy:  appears stated age HEENT: normal Neck supple with no adenopathy JVP normal no bruits no thyromegaly Lungs clear with no  wheezing and good diaphragmatic motion Heart:  S1/S2 no murmur, no rub, gallop or click PMI normal Abdomen: benighn, BS positve, no tenderness, no AAA no bruit.  No HSM or HJR Distal pulses intact with no bruits No edema Neuro non-focal Skin warm and dry No muscular weakness    EKG:   01/29/18 SR rate 61 normal    Recent Labs: 01/11/2018: ALT 27 01/13/2018: B Natriuretic Peptide 170.8 01/16/2018: Magnesium 1.9 01/29/2018: Hemoglobin 14.6; Platelets 230 03/07/2018: BUN 11; Creatinine, Ser 0.77; Potassium 3.9; Sodium 143 09/02/2018: TSH 0.10    Lipid Panel    Component Value Date/Time   CHOL 108 01/12/2018 0319   TRIG 47 01/12/2018 0319   HDL 32 (L) 01/12/2018 0319   CHOLHDL 3.4 01/12/2018 0319   VLDL 9 01/12/2018 0319   LDLCALC 67 01/12/2018 0319       Other studies Reviewed: Additional studies/ records that were reviewed today include: .  TEE  01/17/18 Study Conclusions  - Left ventricle: Diffuse hypo kinesis likely tachycardia induced   DCM given rapid flutter and hyperthyroidism. Systolic function   was severely reduced. The estimated ejection fraction was in the   range of 25% to 30%. - Aortic valve: No evidence of vegetation. - Mitral valve: There was mild regurgitation. - Left atrium: The atrium was moderately dilated. No evidence of   thrombus in the atrial cavity or appendage. - Right ventricle: The cavity size was mildly dilated. - Right atrium: The atrium was dilated. - Atrial septum: No defect or patent foramen ovale was identified. - Pulmonic valve: No evidence of vegetation. - Impressions: No LAA thrombus   Anticoagulated     DCC x 1 200J converted from rapid atrial flutter rate 160 to NSR   rate 98   given 5 mg iv Lopressor to blunt adrenergic tone.     No immediate neurologic sequelae  Impressions:  - No LAA thrombus    Anticoagulated  ASSESSMENT AND PLAN:  1.  Atrial flutter most likely due to hyperthyroidism with substance abuse.  Since  TEE DCCV pt is maintaining SR  CHADVASC now 0 as EF improved to normal will keep on anticoagulation until TSH normalized  2.  Cardiomyopathy - resolved related to hyperthyroidism and rapid afib d/c cozaar and aldactone  3.  Hyperthyroidism euthryoid f/u Dr Everardo All   4.  Polysubstance abuse, no longer using any substance.    Disposition:   FU: me in  6 -8 weeks  Signed, Charlton Haws, MD  09/09/2018 8:05 AM    [facility] [address] [place], [place], [place]  [address]/ [address] [place], [place] Phone: [phone]; Fax: [phone]  [phone]

## 2018-09-09 ENCOUNTER — Ambulatory Visit (INDEPENDENT_AMBULATORY_CARE_PROVIDER_SITE_OTHER): Payer: Managed Care, Other (non HMO) | Admitting: Cardiovascular Disease

## 2018-09-09 ENCOUNTER — Encounter: Payer: Self-pay | Admitting: Cardiovascular Disease

## 2018-09-09 VITALS — BP 126/84 | HR 59 | Ht 74.0 in | Wt 261.1 lb

## 2018-09-09 DIAGNOSIS — E0590 Thyrotoxicosis, unspecified without thyrotoxic crisis or storm: Secondary | ICD-10-CM | POA: Diagnosis not present

## 2018-09-09 DIAGNOSIS — I4892 Unspecified atrial flutter: Secondary | ICD-10-CM | POA: Diagnosis not present

## 2018-09-09 DIAGNOSIS — I429 Cardiomyopathy, unspecified: Secondary | ICD-10-CM

## 2018-09-09 NOTE — Patient Instructions (Addendum)
Medication Instructions:   If you need a refill on your cardiac medications before your next appointment, please call your pharmacy.   Lab work:  If you have labs (blood work) drawn today and your tests are completely normal, you will receive your results only by: Marland Kitchen MyChart Message (if you have MyChart) OR . A paper copy in the mail If you have any lab test that is abnormal or we need to change your treatment, we will call you to review the results.  Testing/Procedures: None ordered today.  Follow-Up: At [facility], you and your health needs are our priority.  As part of our continuing mission to provide you with exceptional heart care, we have created designated Provider Care Teams.  These Care Teams include your primary Cardiologist (physician) and Advanced Practice Providers (APPs -  Physician Assistants and Nurse Practitioners) who all work together to provide you with the care you need, when you need it. You will need a follow up appointment at the end of February or beginning of March.  You may see Charlton Haws, MD or one of the following Advanced Practice Providers on your designated Care Team:   Norma Fredrickson, NP Nada Boozer, NP . Georgie Chard, NP

## 2018-10-09 ENCOUNTER — Ambulatory Visit (INDEPENDENT_AMBULATORY_CARE_PROVIDER_SITE_OTHER): Payer: Managed Care, Other (non HMO) | Admitting: Endocrinology

## 2018-10-09 ENCOUNTER — Encounter: Payer: Self-pay | Admitting: Endocrinology

## 2018-10-09 VITALS — BP 120/60 | HR 62 | Ht 74.0 in | Wt 258.8 lb

## 2018-10-09 DIAGNOSIS — E0590 Thyrotoxicosis, unspecified without thyrotoxic crisis or storm: Secondary | ICD-10-CM | POA: Diagnosis not present

## 2018-10-09 LAB — TSH: TSH: 1.82 u[IU]/mL (ref 0.35–4.50)

## 2018-10-09 LAB — T4, FREE: Free T4: 0.86 ng/dL (ref 0.60–1.60)

## 2018-10-09 NOTE — Patient Instructions (Signed)
Thyroid blood tests are requested for you today.  We'll let you know about the results.  Please come back for a follow-up appointment in 1 month.

## 2018-10-09 NOTE — Progress Notes (Signed)
Subjective:    Patient ID: Logan Meadows, male    DOB: 07/14/1981, 38 y.o.   MRN: [id number]  HPI Pt returns for f/u of hyperthyroidism (dx'ed mid-2019, when he was in the hospital with AF, but he had also used cocaine just prior to admission; he was rx'ed tapazole and coreg; nuc med scan was c/w Grave's Dz; he took RAI 12/19).  pt states he feels well in general.  In particular, anxiety is less now.     Past Medical History:  Diagnosis Date  . Abnormal TSH 01/10/2018  . Alcohol use   . Atrial flutter (HCC) 01/10/2018   a. dx 12/2017 in setting of hyperthyroidism, cocaine, alcohol, THC use.  . Cardiomyopathy (HCC)    a. suspected tachy-mediated - EF 20-25% by echo and 30-35% by TEE 12/2017.  Marland Kitchen Cocaine use   . Graves disease   . Hyperthyroidism   . Marijuana use   . Seasonal allergies     Past Surgical History:  Procedure Laterality Date  . CARDIOVERSION N/A 01/17/2018   Procedure: CARDIOVERSION;  Surgeon: Wendall Stade, MD;  Location: [facility]COPY;  Service: Cardiovascular;  Laterality: N/A;  . None    . TEE WITHOUT CARDIOVERSION N/A 01/17/2018   Procedure: TRANSESOPHAGEAL ECHOCARDIOGRAM (TEE);  Surgeon: Wendall Stade, MD;  Location: [facility] ENDOSCOPY;  Service: Cardiovascular;  Laterality: N/A;    Social History   Socioeconomic History  . Marital status: Single    Spouse name: Not on file  . Number of children: Not on file  . Years of education: Not on file  . Highest education level: Not on file  Occupational History  . Occupation: Sports administrator person    Employer: [employer]  Social Needs  . Financial resource strain: Not on file  . Food insecurity:    Worry: Not on file    Inability: Not on file  . Transportation needs:    Medical: Not on file    Non-medical: Not on file  Tobacco Use  . Smoking status: Former Smoker    Packs/day: 0.25    Last attempt to quit: 01/19/2018    Years since quitting: 0.7  . Smokeless tobacco: Never Used  . Tobacco comment: 1 pack on the  weekends  Substance and Sexual Activity  . Alcohol use: Not Currently    Alcohol/week: 12.0 standard drinks    Types: 12 Cans of beer per week    Comment: 12 pack per weekend  . Drug use: Not Currently    Frequency: 7.0 times per week    Types: Marijuana, Cocaine    Comment: daily marijuana, cocaine on weekends  . Sexual activity: Not on file  Lifestyle  . Physical activity:    Days per week: Not on file    Minutes per session: Not on file  . Stress: Not on file  Relationships  . Social connections:    Talks on phone: Not on file    Gets together: Not on file    Attends religious service: Not on file    Active member of club or organization: Not on file    Attends meetings of clubs or organizations: Not on file    Relationship status: Not on file  . Intimate partner violence:    Fear of current or ex partner: Not on file    Emotionally abused: Not on file    Physically abused: Not on file    Forced sexual activity: Not on file  Other Topics Concern  . Not  on file  Social History Narrative   Pt lives with his mother.     Current Outpatient Medications on File Prior to Visit  Medication Sig Dispense Refill  . acetaminophen (TYLENOL) 500 MG tablet Take 500 mg by mouth every 6 (six) hours as needed.    Marland Kitchen. apixaban (ELIQUIS) 5 MG TABS tablet Take 1 tablet (5 mg total) by mouth 2 (two) times daily. 180 tablet 1  . carvedilol (COREG) 12.5 MG tablet Take 1 tablet (12.5 mg total) by mouth 2 (two) times daily with a meal. 180 tablet 3  . cetirizine (ZYRTEC) 10 MG tablet Take 10 mg by mouth daily.    . fluticasone (FLONASE) 50 MCG/ACT nasal spray Place 2 sprays into both nostrils daily as needed for allergies or rhinitis.     No current facility-administered medications on file prior to visit.     No Known Allergies  Family History  Problem Relation Age of Onset  . Diabetes Mother   . Diabetes Father   . Colon polyps Father   . Thyroid disease Neg Hx     BP 120/60 (BP  Location: Left Arm, Patient Position: Sitting, Cuff Size: Large)   Pulse 62   Ht 6\' 2"  (1.88 m)   Wt 258 lb 12.8 oz (117.4 kg)   SpO2 95%   BMI 33.23 kg/m    Review of Systems Denies palpitations.  He has lost a few lbs.      Objective:   Physical Exam VITAL SIGNS:  See vs page GENERAL: no distress NECK: There is no palpable thyroid enlargement.  No thyroid nodule is palpable.  No palpable lymphadenopathy at the anterior neck.   Lab Results  Component Value Date   TSH 1.82 10/09/2018   T4TOTAL 5.4 03/07/2018      Assessment & Plan:  Hyperthyroidism: better, but he will prob develop hypothyroidism from here.  Drug abuse: he needs to maintain euthyroidism.  Patient Instructions  Thyroid blood tests are requested for you today.  We'll let you know about the results.  Please come back for a follow-up appointment in 1 month.

## 2018-10-16 NOTE — Progress Notes (Signed)
Cardiology Office Note   Date:  10/24/2018   ID:  Logan Meadows, DOB May 19, 1981, MRN [id number]  PCP:  Myles Lipps, MD  Cardiologist:  Dr. Eden Emms    No chief complaint on file.     History of Present Illness: Logan Meadows is a 38 y.o. male who presents for a fib, cardiomyopathy and hyperthyroid. He has a history of substance abuse THC, cocaine and smoking First seen [facility] EF May 2019 with EF 25-30% rapid afib and very hyperthyroid. TEE /  [facility] done due to hard to control rate and CHF. EF 30-35% on TEE Placed on beta blocker and methimazole Last Free T4 05/15/18 .68 normal range TSH also normal at 3.77 . Dr Everardo All wanted him to stopMethimazole and take radioactive iodine pill   F/U TTE done 06/25/18 reviewed EF 55-60% mild LAE   Feels better no cardiac symptoms Reviewed note by Dr Everardo All 09/02/18  And indicated worse off Tapazole but RAI "will work soon"  Free T4 normal 0.86 10/09/18 with TSH still suppressed 0.10 on 10/09/18  Discussed continuing coreg / eliquis until TSH normal off tapazole Then weaning coreg to daily for a couple weeks  TSH/T4 now normal discussed tapering and stopping coreg and ok To d/c eliquis today    Past Medical History:  Diagnosis Date  . Abnormal TSH 01/10/2018  . Alcohol use   . Atrial flutter (HCC) 01/10/2018   a. dx 12/2017 in setting of hyperthyroidism, cocaine, alcohol, THC use.  . Cardiomyopathy (HCC)    a. suspected tachy-mediated - EF 20-25% by echo and 30-35% by TEE 12/2017.  Marland Kitchen Cocaine use   . Graves disease   . Hyperthyroidism   . Marijuana use   . Seasonal allergies     Past Surgical History:  Procedure Laterality Date  . CARDIOVERSION N/A 01/17/2018   Procedure: CARDIOVERSION;  Surgeon: Wendall Stade, MD;  Location: [facility] ENDOSCOPY;  Service: Cardiovascular;  Laterality: N/A;  . None    . TEE WITHOUT CARDIOVERSION N/A 01/17/2018   Procedure: TRANSESOPHAGEAL ECHOCARDIOGRAM (TEE);  Surgeon: Wendall Stade, MD;  Location: [facility]  ENDOSCOPY;  Service: Cardiovascular;  Laterality: N/A;     Current Outpatient Medications  Medication Sig Dispense Refill  . acetaminophen (TYLENOL) 500 MG tablet Take 500 mg by mouth every 6 (six) hours as needed.    Marland Kitchen apixaban (ELIQUIS) 5 MG TABS tablet Take 1 tablet (5 mg total) by mouth 2 (two) times daily. 180 tablet 1  . carvedilol (COREG) 12.5 MG tablet Take 1 tablet (12.5 mg total) by mouth 2 (two) times daily with a meal. 180 tablet 3  . cetirizine (ZYRTEC) 10 MG tablet Take 10 mg by mouth daily.    . fluticasone (FLONASE) 50 MCG/ACT nasal spray Place 2 sprays into both nostrils daily as needed for allergies or rhinitis.     No current facility-administered medications for this visit.     Allergies:   Patient has no known allergies.    Social History:  The patient  reports that he quit smoking about 9 months ago. He smoked 0.25 packs per day. He has never used smokeless tobacco. He reports previous alcohol use of about 12.0 standard drinks of alcohol per week. He reports previous drug use. Frequency: 7.00 times per week. Drugs: Marijuana and Cocaine.   Family History:  The patient's family history includes Colon polyps in his father; Diabetes in his father and mother.    ROS:  General:no colds or fevers, mild weight  increase Skin:no rashes or ulcers HEENT:no blurred vision, no congestion CV:see HPI PUL:see HPI GI:no diarrhea constipation or melena, no indigestion GU:no hematuria, no dysuria MS:no joint pain, no claudication Neuro:no syncope, no lightheadedness Endo:no diabetes, no thyroid disease  Wt Readings from Last 3 Encounters:  10/24/18 120.2 kg  10/09/18 117.4 kg  09/09/18 118.4 kg     PHYSICAL EXAM: VS:  BP 120/80   Pulse (!) 55   Ht 6\' 2"  (1.88 m)   Wt 120.2 kg   SpO2 99%   BMI 34.02 kg/m  , BMI Body mass index is 34.02 kg/m. Affect appropriate Healthy:  appears stated age HEENT: normal Neck supple with no adenopathy JVP normal no bruits no  thyromegaly Lungs clear with no wheezing and good diaphragmatic motion Heart:  S1/S2 no murmur, no rub, gallop or click PMI normal Abdomen: benighn, BS positve, no tenderness, no AAA no bruit.  No HSM or HJR Distal pulses intact with no bruits No edema Neuro non-focal Skin warm and dry No muscular weakness    EKG:   01/29/18 SR rate 61 normal    Recent Labs: 01/11/2018: ALT 27 01/13/2018: B Natriuretic Peptide 170.8 01/16/2018: Magnesium 1.9 01/29/2018: Hemoglobin 14.6; Platelets 230 03/07/2018: BUN 11; Creatinine, Ser 0.77; Potassium 3.9; Sodium 143 10/09/2018: TSH 1.82    Lipid Panel    Component Value Date/Time   CHOL 108 01/12/2018 0319   TRIG 47 01/12/2018 0319   HDL 32 (L) 01/12/2018 0319   CHOLHDL 3.4 01/12/2018 0319   VLDL 9 01/12/2018 0319   LDLCALC 67 01/12/2018 0319       Other studies Reviewed: Additional studies/ records that were reviewed today include: .  TEE  01/17/18 Study Conclusions  - Left ventricle: Diffuse hypo kinesis likely tachycardia induced   DCM given rapid flutter and hyperthyroidism. Systolic function   was severely reduced. The estimated ejection fraction was in the   range of 25% to 30%. - Aortic valve: No evidence of vegetation. - Mitral valve: There was mild regurgitation. - Left atrium: The atrium was moderately dilated. No evidence of   thrombus in the atrial cavity or appendage. - Right ventricle: The cavity size was mildly dilated. - Right atrium: The atrium was dilated. - Atrial septum: No defect or patent foramen ovale was identified. - Pulmonic valve: No evidence of vegetation. - Impressions: No LAA thrombus   Anticoagulated     DCC x 1 200J converted from rapid atrial flutter rate 160 to NSR   rate 98   given 5 mg iv Lopressor to blunt adrenergic tone.     No immediate neurologic sequelae  Impressions:  - No LAA thrombus    Anticoagulated  ASSESSMENT AND PLAN:  1.  Atrial flutter most likely due to  hyperthyroidism with substance abuse.  Since TEE DCCV pt is maintaining SR  CHADVASC now 0 as EF improved to normal TSH/T4 now normal D/c eliquis and taper coreg To off   2.  Cardiomyopathy - resolved related to hyperthyroidism and rapid afib d/c cozaar and aldactone  3.  Hyperthyroidism f/u Everardo All free T4 normal TSH no normal   4.  Polysubstance abuse, no longer using any substance.    Disposition:   FU: me in 3 months   Signed, Charlton Haws, MD  10/24/2018 8:11 AM    [facility] [address] [place], [place], [place]  [address]/ [address] [place], [place] Phone: [phone]; Fax: [phone]  [phone]

## 2018-10-24 ENCOUNTER — Encounter: Payer: Self-pay | Admitting: Cardiovascular Disease

## 2018-10-24 ENCOUNTER — Ambulatory Visit (INDEPENDENT_AMBULATORY_CARE_PROVIDER_SITE_OTHER): Payer: Managed Care, Other (non HMO) | Admitting: Cardiovascular Disease

## 2018-10-24 VITALS — BP 120/80 | HR 55 | Ht 74.0 in | Wt 265.0 lb

## 2018-10-24 DIAGNOSIS — I429 Cardiomyopathy, unspecified: Secondary | ICD-10-CM

## 2018-10-24 DIAGNOSIS — E0590 Thyrotoxicosis, unspecified without thyrotoxic crisis or storm: Secondary | ICD-10-CM

## 2018-10-24 DIAGNOSIS — I4892 Unspecified atrial flutter: Secondary | ICD-10-CM | POA: Diagnosis not present

## 2018-10-24 NOTE — Patient Instructions (Addendum)
Medication Instructions:   If you need a refill on your cardiac medications before your next appointment, please call your pharmacy.   Lab work:  If you have labs (blood work) drawn today and your tests are completely normal, you will receive your results only by: . MyChart Message (if you have MyChart) OR . A paper copy in the mail If you have any lab test that is abnormal or we need to change your treatment, we will call you to review the results.  Testing/Procedures: None ordered today.  Follow-Up: At [facility], you and your health needs are our priority.  As part of our continuing mission to provide you with exceptional heart care, we have created designated Provider Care Teams.  These Care Teams include your primary Cardiologist (physician) and Advanced Practice Providers (APPs -  Physician Assistants and Nurse Practitioners) who all work together to provide you with the care you need, when you need it. You will need a follow up appointment in 3  months.  You may see Peter Nishan, MD or one of the following Advanced Practice Providers on your designated Care Team:   Lori Gerhardt, NP Laura Ingold, NP . Jill McDaniel, NP    

## 2018-11-07 ENCOUNTER — Ambulatory Visit (INDEPENDENT_AMBULATORY_CARE_PROVIDER_SITE_OTHER): Payer: Managed Care, Other (non HMO) | Admitting: Family Medicine

## 2018-11-07 ENCOUNTER — Other Ambulatory Visit: Payer: Self-pay

## 2018-11-07 ENCOUNTER — Encounter: Payer: Self-pay | Admitting: Family Medicine

## 2018-11-07 VITALS — BP 122/74 | HR 60 | Temp 98.0°F | Resp 20 | Ht 73.62 in | Wt 264.2 lb

## 2018-11-07 DIAGNOSIS — I429 Cardiomyopathy, unspecified: Secondary | ICD-10-CM

## 2018-11-07 DIAGNOSIS — I4891 Unspecified atrial fibrillation: Secondary | ICD-10-CM | POA: Diagnosis not present

## 2018-11-07 DIAGNOSIS — R635 Abnormal weight gain: Secondary | ICD-10-CM | POA: Diagnosis not present

## 2018-11-07 DIAGNOSIS — E0590 Thyrotoxicosis, unspecified without thyrotoxic crisis or storm: Secondary | ICD-10-CM

## 2018-11-07 MED ORDER — CARVEDILOL 12.5 MG PO TABS: 12.5000 mg | ORAL_TABLET | Freq: Every day | ORAL | Status: DC

## 2018-11-07 NOTE — Progress Notes (Signed)
3/13/20209:47 AM  Logan Meadows 1981/08/11, 38 y.o. male [id number]  Chief Complaint  Patient presents with  . Establish Care    requesting  thyroid labs done  . Bloated    X 1 week    HPI:   Patient is a 38 y.o. male with past medical history significant for Atrial fib with cardiomyopathy in setting of hyperthyrodism, h/o cocaine use who presents today to establish care  Endo Dr Lorane Gell Cards Dr Eden Emms Last OV with them in Feb 2020 Last echo oct 2019 EF normalized Cards thinking off weaning off coreg once thyroid completely normalizes, stopped eliquis, has done RAI Plan to see cards in 3 months and endo in 1 month  Patient has overall doing well except for the past week he has been feeling sluggish, bloated. Wondering if his thyroid if starting to go low Sees Dr Everardo All on the 19th of this month  Denies any cocaine since hosp May 2019  Exercising a little bit, tries to walk in the evening Was working on diet originally but now not so much   Lab Results  Component Value Date   TSH 1.82 10/09/2018  Last FT4 0.86     Fall Risk  11/07/2018 03/07/2018  Falls in the past year? 0 No  Number falls in past yr: 0 -  Injury with Fall? 0 -  Follow up Falls evaluation completed -     Depression screen [facility] 2/9 11/07/2018 03/07/2018  Decreased Interest 0 0  Down, Depressed, Hopeless 0 0  PHQ - 2 Score 0 0    No Known Allergies  Prior to Admission medications   Medication Sig Start Date End Date Taking? Authorizing Provider  acetaminophen (TYLENOL) 500 MG tablet Take 500 mg by mouth every 6 (six) hours as needed.   Yes [provider]  apixaban (ELIQUIS) 5 MG TABS tablet Take 1 tablet (5 mg total) by mouth 2 (two) times daily. 06/17/18  Yes Doristine Bosworth, MD  carvedilol (COREG) 12.5 MG tablet Take 1 tablet (12.5 mg total) by mouth 2 (two) times daily with a meal. 01/29/18  Yes Dunn, Dayna N, PA-C  cetirizine (ZYRTEC) 10 MG tablet Take 10 mg by mouth daily.    Yes [provider]  fluticasone (FLONASE) 50 MCG/ACT nasal spray Place 2 sprays into both nostrils daily as needed for allergies or rhinitis.   Yes [provider]    Past Medical History:  Diagnosis Date  . Abnormal TSH 01/10/2018  . Alcohol use   . Atrial flutter (HCC) 01/10/2018   a. dx 12/2017 in setting of hyperthyroidism, cocaine, alcohol, THC use.  . Cardiomyopathy (HCC)    a. suspected tachy-mediated - EF 20-25% by echo and 30-35% by TEE 12/2017.  Marland Kitchen Cocaine use   . Graves disease   . Hyperthyroidism   . Marijuana use   . Seasonal allergies     Past Surgical History:  Procedure Laterality Date  . CARDIOVERSION N/A 01/17/2018   Procedure: CARDIOVERSION;  Surgeon: Wendall Stade, MD;  Location: [facility] ENDOSCOPY;  Service: Cardiovascular;  Laterality: N/A;  . None    . TEE WITHOUT CARDIOVERSION N/A 01/17/2018   Procedure: TRANSESOPHAGEAL ECHOCARDIOGRAM (TEE);  Surgeon: Wendall Stade, MD;  Location: [facility] ENDOSCOPY;  Service: Cardiovascular;  Laterality: N/A;    Social History   Tobacco Use  . Smoking status: Former Smoker    Packs/day: 0.25    Last attempt to quit: 01/19/2018    Years since quitting: 0.8  .  Smokeless tobacco: Never Used  . Tobacco comment: 1 pack on the weekends  Substance Use Topics  . Alcohol use: Yes    Alcohol/week: 12.0 standard drinks    Types: 12 Cans of beer per week    Comment: 12 pack per weekend    Family History  Problem Relation Age of Onset  . Diabetes Mother   . Diabetes Father   . Colon polyps Father   . Thyroid disease Neg Hx     Review of Systems  Constitutional: Negative for chills and fever.  Respiratory: Negative for cough and shortness of breath.   Cardiovascular: Negative for chest pain, palpitations and leg swelling.  Gastrointestinal: Negative for abdominal pain, nausea and vomiting.  per hpi   OBJECTIVE:  Blood pressure 122/74, pulse 60, temperature 98 F (36.7 C), temperature source Oral, resp.  rate 20, height 6' 1.62" (1.87 m), weight 264 lb 3.2 oz (119.8 kg), SpO2 98 %. Body mass index is 34.27 kg/m.   Wt Readings from Last 3 Encounters:  11/07/18 264 lb 3.2 oz (119.8 kg)  10/24/18 265 lb (120.2 kg)  10/09/18 258 lb 12.8 oz (117.4 kg)    Physical Exam Vitals signs and nursing note reviewed.  Constitutional:      Appearance: He is well-developed.  HENT:     Head: Normocephalic and atraumatic.  Eyes:     Conjunctiva/sclera: Conjunctivae normal.     Pupils: Pupils are equal, round, and reactive to light.  Neck:     Musculoskeletal: Neck supple.  Cardiovascular:     Rate and Rhythm: Normal rate and regular rhythm.     Heart sounds: No murmur. No friction rub. No gallop.   Pulmonary:     Effort: Pulmonary effort is normal.     Breath sounds: Normal breath sounds. No wheezing or rales.  Skin:    General: Skin is warm and dry.  Neurological:     Mental Status: He is alert and oriented to person, place, and time.     ASSESSMENT and PLAN  1. Hyperthyroidism 2. Atrial fibrillation, unspecified type (HCC) 3. Cardiomyopathy, unspecified type (HCC) Recovering well. Managed by endo and cards. Labs cc'd endo. Weaning off coreg per cards instructions.  - TSH - T4, Free  4. Weight gain More than 50% of this 25 minute visit was spent on counseling and coordination of care. Discussed importance of low carb diet, regular exercise and healthy weight.   Other orders   Return in about 3 months (around 02/07/2019).    Myles Lipps, MD Primary Care at [facility] [address] [place], [place] [address] Ph.  [phone] Fax [phone]

## 2018-11-07 NOTE — Patient Instructions (Addendum)
If you have lab work done today you will be contacted with your lab results within the next 2 weeks.  If you have not heard from [place] then please contact us. The fastest way to get your results is to register for My Chart.   IF you received an x-ray today, you will receive an invoice from [facility]. Please contact [facility] at [phone] with questions or concerns regarding your invoice.   IF you received labwork today, you will receive an invoice from [facility]. Please contact [facility] at [phone] with questions or concerns regarding your invoice.   Our billing staff will not be able to assist you with questions regarding bills from these companies.  You will be contacted with the lab results as soon as they are available. The fastest way to get your results is to activate your My Chart account. Instructions are located on the last page of this paperwork. If you have not heard from [place] regarding the results in 2 weeks, please contact this office.     Calorie Counting for Weight Loss Calories are units of energy. Your body needs a certain amount of calories from food to keep you going throughout the day. When you eat more calories than your body needs, your body stores the extra calories as fat. When you eat fewer calories than your body needs, your body burns fat to get the energy it needs. Calorie counting means keeping track of how many calories you eat and drink each day. Calorie counting can be helpful if you need to lose weight. If you make sure to eat fewer calories than your body needs, you should lose weight. Ask your health care provider what a healthy weight is for you. For calorie counting to work, you will need to eat the right number of calories in a day in order to lose a healthy amount of weight per week. A dietitian can help you determine how many calories you need in a day and will give you suggestions on how to reach your calorie goal.  A healthy  amount of weight to lose per week is usually 1-2 lb (0.5-0.9 kg). This usually means that your daily calorie intake should be reduced by 500-750 calories.  Eating 1,200 - 1,500 calories per day can help most women lose weight.  Eating 1,500 - 1,800 calories per day can help most men lose weight. What is my plan? My goal is to have 2700 calories per day. If I have this many calories per day, I should lose around ___15_______ pounds in 3 months What do I need to know about calorie counting? In order to meet your daily calorie goal, you will need to:  Find out how many calories are in each food you would like to eat. Try to do this before you eat.  Decide how much of the food you plan to eat.  Write down what you ate and how many calories it had. Doing this is called keeping a food log. To successfully lose weight, it is important to balance calorie counting with a healthy lifestyle that includes regular activity. Aim for 150 minutes of moderate exercise (such as walking) or 75 minutes of vigorous exercise (such as running) each week. Where do I find calorie information?  The number of calories in a food can be found on a Nutrition Facts label. If a food does not have a Nutrition Facts label, try to look up the calories online or ask your dietitian  for help. Remember that calories are listed per serving. If you choose to have more than one serving of a food, you will have to multiply the calories per serving by the amount of servings you plan to eat. For example, the label on a package of bread might say that a serving size is 1 slice and that there are 90 calories in a serving. If you eat 1 slice, you will have eaten 90 calories. If you eat 2 slices, you will have eaten 180 calories. How do I keep a food log? Immediately after each meal, record the following information in your food log:  What you ate. Don't forget to include toppings, sauces, and other extras on the food.  How much you ate.  This can be measured in cups, ounces, or number of items.  How many calories each food and drink had.  The total number of calories in the meal. Keep your food log near you, such as in a small notebook in your pocket, or use a mobile app or website. Some programs will calculate calories for you and show you how many calories you have left for the day to meet your goal. What are some calorie counting tips?   Use your calories on foods and drinks that will fill you up and not leave you hungry: ? Some examples of foods that fill you up are nuts and nut butters, vegetables, lean proteins, and high-fiber foods like whole grains. High-fiber foods are foods with more than 5 g fiber per serving. ? Drinks such as sodas, specialty coffee drinks, alcohol, and juices have a lot of calories, yet do not fill you up.  Eat nutritious foods and avoid empty calories. Empty calories are calories you get from foods or beverages that do not have many vitamins or protein, such as candy, sweets, and soda. It is better to have a nutritious high-calorie food (such as an avocado) than a food with few nutrients (such as a bag of chips).  Know how many calories are in the foods you eat most often. This will help you calculate calorie counts faster.  Pay attention to calories in drinks. Low-calorie drinks include water and unsweetened drinks.  Pay attention to nutrition labels for "low fat" or "fat free" foods. These foods sometimes have the same amount of calories or more calories than the full fat versions. They also often have added sugar, starch, or salt, to make up for flavor that was removed with the fat.  Find a way of tracking calories that works for you. Get creative. Try different apps or programs if writing down calories does not work for you. What are some portion control tips?  Know how many calories are in a serving. This will help you know how many servings of a certain food you can have.  Use a  measuring cup to measure serving sizes. You could also try weighing out portions on a kitchen scale. With time, you will be able to estimate serving sizes for some foods.  Take some time to put servings of different foods on your favorite plates, bowls, and cups so you know what a serving looks like.  Try not to eat straight from a bag or box. Doing this can lead to overeating. Put the amount you would like to eat in a cup or on a plate to make sure you are eating the right portion.  Use smaller plates, glasses, and bowls to prevent overeating.  Try not to multitask (for  example, watch TV or use your computer) while eating. If it is time to eat, sit down at a table and enjoy your food. This will help you to know when you are full. It will also help you to be aware of what you are eating and how much you are eating. What are tips for following this plan? Reading food labels  Check the calorie count compared to the serving size. The serving size may be smaller than what you are used to eating.  Check the source of the calories. Make sure the food you are eating is high in vitamins and protein and low in saturated and trans fats. Shopping  Read nutrition labels while you shop. This will help you make healthy decisions before you decide to purchase your food.  Make a grocery list and stick to it. Cooking  Try to cook your favorite foods in a healthier way. For example, try baking instead of frying.  Use low-fat dairy products. Meal planning  Use more fruits and vegetables. Half of your plate should be fruits and vegetables.  Include lean proteins like poultry and fish. How do I count calories when eating out?  Ask for smaller portion sizes.  Consider sharing an entree and sides instead of getting your own entree.  If you get your own entree, eat only half. Ask for a box at the beginning of your meal and put the rest of your entree in it so you are not tempted to eat it.  If calories  are listed on the menu, choose the lower calorie options.  Choose dishes that include vegetables, fruits, whole grains, low-fat dairy products, and lean protein.  Choose items that are boiled, broiled, grilled, or steamed. Stay away from items that are buttered, battered, fried, or served with cream sauce. Items labeled "crispy" are usually fried, unless stated otherwise.  Choose water, low-fat milk, unsweetened iced tea, or other drinks without added sugar. If you want an alcoholic beverage, choose a lower calorie option such as a glass of wine or light beer.  Ask for dressings, sauces, and syrups on the side. These are usually high in calories, so you should limit the amount you eat.  If you want a salad, choose a garden salad and ask for grilled meats. Avoid extra toppings like bacon, cheese, or fried items. Ask for the dressing on the side, or ask for olive oil and vinegar or lemon to use as dressing.  Estimate how many servings of a food you are given. For example, a serving of cooked rice is  cup or about the size of half a baseball. Knowing serving sizes will help you be aware of how much food you are eating at restaurants. The list below tells you how big or small some common portion sizes are based on everyday objects: ? 1 oz-4 stacked dice. ? 3 oz-1 deck of cards. ? 1 tsp-1 die. ? 1 Tbsp- a ping-pong ball. ? 2 Tbsp-1 ping-pong ball. ?  cup- baseball. ? 1 cup-1 baseball. Summary  Calorie counting means keeping track of how many calories you eat and drink each day. If you eat fewer calories than your body needs, you should lose weight.  A healthy amount of weight to lose per week is usually 1-2 lb (0.5-0.9 kg). This usually means reducing your daily calorie intake by 500-750 calories.  The number of calories in a food can be found on a Nutrition Facts label. If a food does not have a Nutrition   Facts label, try to look up the calories online or ask your dietitian for help.  Use  your calories on foods and drinks that will fill you up, and not on foods and drinks that will leave you hungry.  Use smaller plates, glasses, and bowls to prevent overeating. This information is not intended to replace advice given to you by your health care provider. Make sure you discuss any questions you have with your health care provider. Document Released: 08/13/2005 Document Revised: 05/02/2018 Document Reviewed: 07/13/2016 Elsevier Interactive Patient Education  2019 Elsevier Inc.  

## 2018-11-08 LAB — T4, FREE: Free T4: 1.05 ng/dL (ref 0.82–1.77)

## 2018-11-08 LAB — TSH: TSH: 1.53 u[IU]/mL (ref 0.450–4.500)

## 2018-11-13 ENCOUNTER — Other Ambulatory Visit: Payer: Self-pay

## 2018-11-13 ENCOUNTER — Encounter: Payer: Self-pay | Admitting: Endocrinology

## 2018-11-13 ENCOUNTER — Ambulatory Visit (INDEPENDENT_AMBULATORY_CARE_PROVIDER_SITE_OTHER): Payer: Managed Care, Other (non HMO) | Admitting: Endocrinology

## 2018-11-13 ENCOUNTER — Encounter: Payer: Self-pay | Admitting: Radiology

## 2018-11-13 VITALS — BP 134/80 | HR 73 | Ht 73.0 in | Wt 268.0 lb

## 2018-11-13 DIAGNOSIS — E0590 Thyrotoxicosis, unspecified without thyrotoxic crisis or storm: Secondary | ICD-10-CM

## 2018-11-13 NOTE — Patient Instructions (Signed)
No medication is needed for the thyroid now.  Please come back for a follow-up appointment in 6 weeks.

## 2018-11-13 NOTE — Progress Notes (Signed)
Subjective:    Patient ID: Logan Meadows, male    DOB: Aug 24, 1981, 38 y.o.   MRN: [id number]  HPI Pt returns for f/u of hyperthyroidism (dx'ed mid-2019, when he was in the hospital with AF, but he had also used cocaine just prior to admission; he was rx'ed Tapazole and Coreg; nuc med scan was c/w Grave's Dz; he took RAI 12/19).  pt states he feels well in general, except for abd bloating.  Past Medical History:  Diagnosis Date  . Abnormal TSH 01/10/2018  . Alcohol use   . Atrial flutter (HCC) 01/10/2018   a. dx 12/2017 in setting of hyperthyroidism, cocaine, alcohol, THC use.  . Cardiomyopathy (HCC)    a. suspected tachy-mediated - EF 20-25% by echo and 30-35% by TEE 12/2017.  Marland Kitchen Cocaine use   . Graves disease   . Hyperthyroidism   . Marijuana use   . Seasonal allergies     Past Surgical History:  Procedure Laterality Date  . CARDIOVERSION N/A 01/17/2018   Procedure: CARDIOVERSION;  Surgeon: Wendall Stade, MD;  Location: [facility] ENDOSCOPY;  Service: Cardiovascular;  Laterality: N/A;  . None    . TEE WITHOUT CARDIOVERSION N/A 01/17/2018   Procedure: TRANSESOPHAGEAL ECHOCARDIOGRAM (TEE);  Surgeon: Wendall Stade, MD;  Location: [facility] ENDOSCOPY;  Service: Cardiovascular;  Laterality: N/A;    Social History   Socioeconomic History  . Marital status: Single    Spouse name: Not on file  . Number of children: 0  . Years of education: Not on file  . Highest education level: Not on file  Occupational History  . Occupation: Sports administrator person    Employer: [employer]  Social Needs  . Financial resource strain: Not on file  . Food insecurity:    Worry: Not on file    Inability: Not on file  . Transportation needs:    Medical: Not on file    Non-medical: Not on file  Tobacco Use  . Smoking status: Former Smoker    Packs/day: 0.25    Last attempt to quit: 01/19/2018    Years since quitting: 0.8  . Smokeless tobacco: Never Used  . Tobacco comment: 1 pack on the weekends  Substance and  Sexual Activity  . Alcohol use: Yes    Alcohol/week: 12.0 standard drinks    Types: 12 Cans of beer per week    Comment: 12 pack per weekend  . Drug use: Not Currently    Frequency: 7.0 times per week    Types: Marijuana, Cocaine    Comment: daily marijuana, cocaine on weekends  . Sexual activity: Yes  Lifestyle  . Physical activity:    Days per week: Not on file    Minutes per session: Not on file  . Stress: Not on file  Relationships  . Social connections:    Talks on phone: Not on file    Gets together: Not on file    Attends religious service: Not on file    Active member of club or organization: Not on file    Attends meetings of clubs or organizations: Not on file    Relationship status: Not on file  . Intimate partner violence:    Fear of current or ex partner: Not on file    Emotionally abused: Not on file    Physically abused: Not on file    Forced sexual activity: Not on file  Other Topics Concern  . Not on file  Social History Narrative   Pt lives with  his mother.     Current Outpatient Medications on File Prior to Visit  Medication Sig Dispense Refill  . acetaminophen (TYLENOL) 500 MG tablet Take 500 mg by mouth every 6 (six) hours as needed.    . carvedilol (COREG) 12.5 MG tablet Take 1 tablet (12.5 mg total) by mouth daily.    . cetirizine (ZYRTEC) 10 MG tablet Take 10 mg by mouth daily.    . fluticasone (FLONASE) 50 MCG/ACT nasal spray Place 2 sprays into both nostrils daily as needed for allergies or rhinitis.     No current facility-administered medications on file prior to visit.     No Known Allergies  Family History  Problem Relation Age of Onset  . Diabetes Mother   . Diabetes Father   . Colon polyps Father   . Thyroid disease Neg Hx     BP 134/80 (BP Location: Left Arm, Patient Position: Sitting, Cuff Size: Large)   Pulse 73   Ht 6\' 1"  (1.854 m)   Wt 268 lb (121.6 kg)   SpO2 97%   BMI 35.36 kg/m    Review of Systems He has gained  weight.     Objective:   Physical Exam VITAL SIGNS:  See vs page GENERAL: no distress NECK: There is no palpable thyroid enlargement.  No thyroid nodule is palpable.  No palpable lymphadenopathy at the anterior neck.    Lab Results  Component Value Date   TSH 1.530 11/07/2018   T4TOTAL 5.4 03/07/2018      Assessment & Plan:  Hyperthyroidism: better after RAI, but has not yet developed hypothyroidism. abd bloating: not thyroid-related  Patient Instructions  No medication is needed for the thyroid now.  Please come back for a follow-up appointment in 6 weeks.

## 2018-12-23 ENCOUNTER — Other Ambulatory Visit: Payer: Self-pay

## 2018-12-25 ENCOUNTER — Encounter: Payer: Self-pay | Admitting: Endocrinology

## 2018-12-25 ENCOUNTER — Ambulatory Visit (INDEPENDENT_AMBULATORY_CARE_PROVIDER_SITE_OTHER): Payer: Managed Care, Other (non HMO) | Admitting: Endocrinology

## 2018-12-25 ENCOUNTER — Other Ambulatory Visit: Payer: Self-pay

## 2018-12-25 VITALS — BP 118/78 | HR 64 | Temp 98.5°F | Wt 266.2 lb

## 2018-12-25 DIAGNOSIS — E0590 Thyrotoxicosis, unspecified without thyrotoxic crisis or storm: Secondary | ICD-10-CM

## 2018-12-25 LAB — TSH: TSH: 3.2 u[IU]/mL (ref 0.35–4.50)

## 2018-12-25 LAB — T4, FREE: Free T4: 0.76 ng/dL (ref 0.60–1.60)

## 2018-12-25 NOTE — Patient Instructions (Addendum)
Blood tests are requested for you today.  We'll let you know about the results.  Please come back for a follow-up appointment in 2 months.   

## 2018-12-25 NOTE — Progress Notes (Signed)
Subjective:    Patient ID: Logan Meadows, male    DOB: 12/11/1980, 38 y.o.   MRN: [id number]  HPI Pt returns for f/u of hyperthyroidism (dx'ed mid-2019, when he was in the hospital with AF, but he had also used cocaine just prior to admission; he was initially rx'ed Tapazole and Coreg; nuc med scan was c/w Grave's Dz; he then took RAI 12/19).  pt states he feels well in general.   Past Medical History:  Diagnosis Date  . Abnormal TSH 01/10/2018  . Alcohol use   . Atrial flutter (HCC) 01/10/2018   a. dx 12/2017 in setting of hyperthyroidism, cocaine, alcohol, THC use.  . Cardiomyopathy (HCC)    a. suspected tachy-mediated - EF 20-25% by echo and 30-35% by TEE 12/2017.  Marland Kitchen Cocaine use   . Graves disease   . Hyperthyroidism   . Marijuana use   . Seasonal allergies     Past Surgical History:  Procedure Laterality Date  . CARDIOVERSION N/A 01/17/2018   Procedure: CARDIOVERSION;  Surgeon: Wendall Stade, MD;  Location: [facility] ENDOSCOPY;  Service: Cardiovascular;  Laterality: N/A;  . None    . TEE WITHOUT CARDIOVERSION N/A 01/17/2018   Procedure: TRANSESOPHAGEAL ECHOCARDIOGRAM (TEE);  Surgeon: Wendall Stade, MD;  Location: [facility] ENDOSCOPY;  Service: Cardiovascular;  Laterality: N/A;    Social History   Socioeconomic History  . Marital status: Single    Spouse name: Not on file  . Number of children: 0  . Years of education: Not on file  . Highest education level: Not on file  Occupational History  . Occupation: Sports administrator person    Employer: [employer]  Social Needs  . Financial resource strain: Not on file  . Food insecurity:    Worry: Not on file    Inability: Not on file  . Transportation needs:    Medical: Not on file    Non-medical: Not on file  Tobacco Use  . Smoking status: Former Smoker    Packs/day: 0.25    Last attempt to quit: 01/19/2018    Years since quitting: 0.9  . Smokeless tobacco: Never Used  . Tobacco comment: 1 pack on the weekends  Substance and Sexual  Activity  . Alcohol use: Yes    Alcohol/week: 12.0 standard drinks    Types: 12 Cans of beer per week    Comment: 12 pack per weekend  . Drug use: Not Currently    Frequency: 7.0 times per week    Types: Marijuana, Cocaine    Comment: daily marijuana, cocaine on weekends  . Sexual activity: Yes  Lifestyle  . Physical activity:    Days per week: Not on file    Minutes per session: Not on file  . Stress: Not on file  Relationships  . Social connections:    Talks on phone: Not on file    Gets together: Not on file    Attends religious service: Not on file    Active member of club or organization: Not on file    Attends meetings of clubs or organizations: Not on file    Relationship status: Not on file  . Intimate partner violence:    Fear of current or ex partner: Not on file    Emotionally abused: Not on file    Physically abused: Not on file    Forced sexual activity: Not on file  Other Topics Concern  . Not on file  Social History Narrative   Pt lives with his  mother.     Current Outpatient Medications on File Prior to Visit  Medication Sig Dispense Refill  . acetaminophen (TYLENOL) 500 MG tablet Take 500 mg by mouth every 6 (six) hours as needed.    . carvedilol (COREG) 12.5 MG tablet Take 1 tablet (12.5 mg total) by mouth daily.    . cetirizine (ZYRTEC) 10 MG tablet Take 10 mg by mouth daily.    . fluticasone (FLONASE) 50 MCG/ACT nasal spray Place 2 sprays into both nostrils daily as needed for allergies or rhinitis.     No current facility-administered medications on file prior to visit.     No Known Allergies  Family History  Problem Relation Age of Onset  . Diabetes Mother   . Diabetes Father   . Colon polyps Father   . Thyroid disease Neg Hx     BP 118/78 (BP Location: Left Arm, Patient Position: Sitting, Cuff Size: Normal)   Pulse 64   Temp 98.5 F (36.9 C) (Oral)   Wt 266 lb 3.2 oz (120.7 kg)   SpO2 96%   BMI 35.12 kg/m   Review of Systems  Denies palpitations.     Objective:   Physical Exam VITAL SIGNS:  See vs page GENERAL: no distress.  NECK: There is no palpable thyroid enlargement.  No thyroid nodule is palpable.  No palpable lymphadenopathy at the anterior neck.     Lab Results  Component Value Date   TSH 3.20 12/25/2018   T4TOTAL 5.4 03/07/2018      Assessment & Plan:  Hyperthyroidism: better after RAI rx.  However, euthyroidism seldom persists for long after RAI.  We discussed this.  AF: in this setting, he should maintain euthyroidism.   Patient Instructions  Blood tests are requested for you today.  We'll let you know about the results.   Please come back for a follow-up appointment in 2 months.

## 2019-01-20 ENCOUNTER — Encounter: Payer: Self-pay | Admitting: *Deleted

## 2019-01-20 ENCOUNTER — Telehealth: Payer: Self-pay | Admitting: *Deleted

## 2019-01-20 NOTE — Telephone Encounter (Signed)
Virtual Visit Pre-Appointment Phone Call  "(Name), I am calling you today to discuss your upcoming appointment. We are currently trying to limit exposure to the virus that causes COVID-19 by seeing patients at home rather than in the office."  1. "What is the BEST phone number to call the day of the visit?" - include this in appointment notes  2. "Do you have or have access to (through a family member/friend) a smartphone with video capability that we can use for your visit?" a. If yes - list this number in appt notes as "cell" (if different from BEST phone #) and list the appointment type as a VIDEO visit in appointment notes b. If no - list the appointment type as a PHONE visit in appointment notes  3. Confirm consent - "In the setting of the current Covid19 crisis, you are scheduled for a (phone or video) visit with your provider on (date) at (time).  Just as we do with many in-office visits, in order for you to participate in this visit, we must obtain consent.  If you'd like, I can send this to your mychart (if signed up) or email for you to review.  Otherwise, I can obtain your verbal consent now.  All virtual visits are billed to your insurance company just like a normal visit would be.  By agreeing to a virtual visit, we'd like you to understand that the technology does not allow for your provider to perform an examination, and thus may limit your provider's ability to fully assess your condition. If your provider identifies any concerns that need to be evaluated in person, we will make arrangements to do so.  Finally, though the technology is pretty good, we cannot assure that it will always work on either your or our end, and in the setting of a video visit, we may have to convert it to a phone-only visit.  In either situation, we cannot ensure that we have a secure connection.  Are you willing to proceed?" STAFF: Did the patient verbally acknowledge consent to telehealth visit? Document  YES/NO here: YES  4. Advise patient to be prepared - "Two hours prior to your appointment, go ahead and check your blood pressure, pulse, oxygen saturation, and your weight (if you have the equipment to check those) and write them all down. When your visit starts, your provider will ask you for this information. If you have an Apple Watch or Kardia device, please plan to have heart rate information ready on the day of your appointment. Please have a pen and paper handy nearby the day of the visit as well."  5. Give patient instructions for MyChart download to smartphone OR Doximity/Doxy.me as below if video visit (depending on what platform provider is using)  6. Inform patient they will receive a phone call 15 minutes prior to their appointment time (may be from unknown caller ID) so they should be prepared to answer    TELEPHONE CALL NOTE  Logan Meadows has been deemed a candidate for a follow-up tele-health visit to limit community exposure during the Covid-19 pandemic. I spoke with the patient via phone to ensure availability of phone/video source, confirm preferred email & phone number, and discuss instructions and expectations.  I reminded Logan Meadows to be prepared with any vital sign and/or heart rhythm information that could potentially be obtained via home monitoring, at the time of his visit. I reminded Logan Meadows to expect a phone call prior to  his visit.  Elliot Cousin, RMA 01/20/2019 2:26 PM   INSTRUCTIONS FOR DOWNLOADING THE MYCHART APP TO SMARTPHONE  - The patient must first make sure to have activated MyChart and know their login information - If Apple, go to [employer] and type in MyChart in the search bar and download the app. If Android, ask patient to go to [employer] and type in [place] in the search bar and download the app. The app is free but as with any other app downloads, their phone may require them to verify saved payment information  or Apple/Android password.  - The patient will need to then log into the app with their MyChart username and password, and select [facility] as their healthcare provider to link the account. When it is time for your visit, go to the MyChart app, find appointments, and click Begin Video Visit. Be sure to Select Allow for your device to access the Microphone and Camera for your visit. You will then be connected, and your provider will be with you shortly.  **If they have any issues connecting, or need assistance please contact MyChart service desk [phone])**  **If using a computer, in order to ensure the best quality for their visit they will need to use either of the following Internet Browsers: [employer] or Google Chrome**  IF USING DOXIMITY or DOXY.ME - The patient will receive a link just prior to their visit by text.     FULL LENGTH CONSENT FOR TELE-HEALTH VISIT   I hereby voluntarily request, consent and authorize [facility] and its employed or contracted physicians, physician assistants, nurse practitioners or other licensed health care professionals (the Practitioner), to provide me with telemedicine health care services (the "Services") as deemed necessary by the treating Practitioner. I acknowledge and consent to receive the Services by the Practitioner via telemedicine. I understand that the telemedicine visit will involve communicating with the Practitioner through live audiovisual communication technology and the disclosure of certain medical information by electronic transmission. I acknowledge that I have been given the opportunity to request an in-person assessment or other available alternative prior to the telemedicine visit and am voluntarily participating in the telemedicine visit.  I understand that I have the right to withhold or withdraw my consent to the use of telemedicine in the course of my care at any time, without affecting my right to future  care or treatment, and that the Practitioner or I may terminate the telemedicine visit at any time. I understand that I have the right to inspect all information obtained and/or recorded in the course of the telemedicine visit and may receive copies of available information for a reasonable fee.  I understand that some of the potential risks of receiving the Services via telemedicine include:  Marland Kitchen Delay or interruption in medical evaluation due to technological equipment failure or disruption; . Information transmitted may not be sufficient (e.g. poor resolution of images) to allow for appropriate medical decision making by the Practitioner; and/or  . In rare instances, security protocols could fail, causing a breach of personal health information.  Furthermore, I acknowledge that it is my responsibility to provide information about my medical history, conditions and care that is complete and accurate to the best of my ability. I acknowledge that Practitioner's advice, recommendations, and/or decision may be based on factors not within their control, such as incomplete or inaccurate data provided by me or distortions of diagnostic images or specimens that may result from electronic transmissions. I  understand that the practice of medicine is not an exact science and that Practitioner makes no warranties or guarantees regarding treatment outcomes. I acknowledge that I will receive a copy of this consent concurrently upon execution via email to the email address I last provided but may also request a printed copy by calling the office of [facility].    I understand that my insurance will be billed for this visit.   I have read or had this consent read to me. . I understand the contents of this consent, which adequately explains the benefits and risks of the Services being provided via telemedicine.  . I have been provided ample opportunity to ask questions regarding this consent and the Services and have  had my questions answered to my satisfaction. . I give my informed consent for the services to be provided through the use of telemedicine in my medical care  By participating in this telemedicine visit I agree to the above.

## 2019-01-20 NOTE — Progress Notes (Signed)
Virtual Visit via Video Note   This visit type was conducted due to national recommendations for restrictions regarding the COVID-19 Pandemic (e.g. social distancing) in an effort to limit this patient's exposure and mitigate transmission in our community.  Due to his co-morbid illnesses, this patient is at least at moderate risk for complications without adequate follow up.  This format is felt to be most appropriate for this patient at this time.  All issues noted in this document were discussed and addressed.  A limited physical exam was performed with this format.  Please refer to the patient's chart for his consent to telehealth for [facility].   Date:  01/21/2019   ID:  Logan Meadows, DOB [birth year], MRN [id number]  Patient Location: Home Provider Location: Office  PCP:  Myles Lipps, MD  Cardiologist:  Charlton Haws, MD  Electrophysiologist:  None   Evaluation Performed:  Follow-Up Visit  Chief Complaint:  Atrial fib   History of Present Illness:    Logan Meadows is a 38 y.o. male with presents for a fib, cardiomyopathy and hyperthyroid. He has a history of substance abuse THC, cocaine and smoking First seen [facility] EF May 2019 with EF 25-30% rapid afib and very hyperthyroid. TEE /  [facility] done due to hard to control rate and CHF. EF 30-35% on TEE Placed on beta blocker and methimazole Last Free T4 05/15/18 .68 normal range TSH also normal at 3.77 . Dr Everardo All wanted him to stopMethimazole and take radioactive iodine pill   F/U TTE done 06/25/18 reviewed EF 55-60% mild LAE   On last visit  "Feels better no cardiac symptoms Reviewed note by Dr Everardo All 09/02/18  And indicated worse off Tapazole but RAI "will work soon"  Free T4 normal 0.86 10/09/18 with TSH still suppressed 0.10 on 10/09/18"  Discussed continuing coreg / eliquis until TSH normal off tapazole Then weaning coreg to daily for a couple weeks  TSH/T4 now normal discussed tapering and stopping coreg and ok  To d/c eliquis today with normal EF and CHA2DS2VASc of 0.   Today he is on coreg 12.5 once daly. No rapid Heart beat and no chest pain/SOB.  He feels well.  No cough or fevers.  He wears mask at work and when out.  He does exercise on non rainy days by walking.  He no longer smokes - has done nothing for a year.  last TSH on 12/25/18  3.20 and Free T4 of 0.76.  No longer on meds for thyroid.  The patient does not have symptoms concerning for COVID-19 infection (fever, chills, cough, or new shortness of breath).    Past Medical History:  Diagnosis Date  . Abnormal TSH 01/10/2018  . Alcohol use   . Atrial flutter (HCC) 01/10/2018   a. dx 12/2017 in setting of hyperthyroidism, cocaine, alcohol, THC use.  . Cardiomyopathy (HCC)    a. suspected tachy-mediated - EF 20-25% by echo and 30-35% by TEE 12/2017.  Marland Kitchen Cocaine use   . Graves disease   . Hyperthyroidism   . Marijuana use   . Seasonal allergies    Past Surgical History:  Procedure Laterality Date  . CARDIOVERSION N/A 01/17/2018   Procedure: CARDIOVERSION;  Surgeon: Wendall Stade, MD;  Location: [facility] ENDOSCOPY;  Service: Cardiovascular;  Laterality: N/A;  . None    . TEE WITHOUT CARDIOVERSION N/A 01/17/2018   Procedure: TRANSESOPHAGEAL ECHOCARDIOGRAM (TEE);  Surgeon: Wendall Stade, MD;  Location: [facility] ENDOSCOPY;  Service: Cardiovascular;  Laterality: N/A;     Current Meds  Medication Sig  . acetaminophen (TYLENOL) 500 MG tablet Take 500 mg by mouth every 6 (six) hours as needed.  . carvedilol (COREG) 12.5 MG tablet Take 1 tablet (12.5 mg total) by mouth daily.  . cetirizine (ZYRTEC) 10 MG tablet Take 10 mg by mouth daily.  . fluticasone (FLONASE) 50 MCG/ACT nasal spray Place 2 sprays into both nostrils daily as needed for allergies or rhinitis.     Allergies:   Patient has no known allergies.   Social History   Tobacco Use  . Smoking status: Former Smoker    Packs/day: 0.25    Last attempt to quit: 01/19/2018    Years since  quitting: 1.0  . Smokeless tobacco: Never Used  . Tobacco comment: 1 pack on the weekends  Substance Use Topics  . Alcohol use: Yes    Alcohol/week: 12.0 standard drinks    Types: 12 Cans of beer per week    Comment: 12 pack per weekend  . Drug use: Not Currently    Frequency: 7.0 times per week    Types: Marijuana, Cocaine    Comment: daily marijuana, cocaine on weekends     Family Hx: The patient's family history includes Colon polyps in his father; Diabetes in his father and mother. There is no history of Thyroid disease.  ROS:   Please see the history of present illness.    General:no colds or fevers, no weight changes Skin:no rashes or ulcers HEENT:no blurred vision, no congestion CV:see HPI PUL:see HPI GI:no diarrhea constipation or melena, no indigestion GU:no hematuria, no dysuria MS:no joint pain, no claudication Neuro:no syncope, no lightheadedness Endo:no diabetes, + thyroid disease  All other systems reviewed and are negative.   Prior CV studies:   The following studies were reviewed today: Echo 06/25/18  Study Conclusions  - Left ventricle: The cavity size was normal. Systolic function was   normal. The estimated ejection fraction was in the range of 55%   to 60%. Wall motion was normal; there were no regional wall   motion abnormalities. Left ventricular diastolic function   parameters were normal. - Left atrium: The atrium was mildly dilated. - Atrial septum: No defect or patent foramen ovale was identified.  Echo 04/01/18 Study Conclusions  - Left ventricle: The cavity size was normal. Systolic function was   normal. The estimated ejection fraction was in the range of 50%   to 55%. Wall motion was normal; there were no regional wall   motion abnormalities. Findings consistent with impaired   relaxation, but does not meet diagnostic criteria for diastolic   dysfunction. - Aortic valve: Transvalvular velocity was within the normal range.   There  was no stenosis. There was no regurgitation. - Mitral valve: There was trivial regurgitation. - Right ventricle: Systolic function was normal. - Atrial septum: No defect or patent foramen ovale was identified. - Tricuspid valve: There was trivial regurgitation. - Pulmonic valve: There was no significant regurgitation.  Impressions:  - In NSR at 50 bpm. EF now normal, much improved compared to prior   studies with atrial fibrillation with RVR.   Echo 01/11/18 Study Conclusions  - Left ventricle: The cavity size was moderately dilated. Wall   thickness was normal. Systolic function was severely reduced. The   estimated ejection fraction was in the range of 25% to 30%.   Diffuse hypokinesis. The study is not technically sufficient to   allow evaluation of LV diastolic function. Doppler  parameters are   consistent with high ventricular filling pressure. - Mitral valve: There was mild regurgitation. - Left atrium: The atrium was moderately to severely dilated. - Right ventricle: The cavity size was mildly dilated. - Right atrium: The atrium was mildly dilated. - Pericardium, extracardiac: A small pericardial effusion was   identified. There was a left pleural effusion.  Impressions:  - Severe global reduction in LV systolic function; 4 chamber   enlargement; mild MR and TR; small pericardial effusion.  Labs/Other Tests and Data Reviewed:    EKG:  An ECG dated 01/25/18 was personally reviewed today and demonstrated:  SR normal EKG  Recent Labs: 01/29/2018: Hemoglobin 14.6; Platelets 230 03/07/2018: BUN 11; Creatinine, Ser 0.77; Potassium 3.9; Sodium 143 12/25/2018: TSH 3.20   Recent Lipid Panel Lab Results  Component Value Date/Time   CHOL 108 01/12/2018 03:19 AM   TRIG 47 01/12/2018 03:19 AM   HDL 32 (L) 01/12/2018 03:19 AM   CHOLHDL 3.4 01/12/2018 03:19 AM   LDLCALC 67 01/12/2018 03:19 AM    Wt Readings from Last 3 Encounters:  01/21/19 265 lb (120.2 kg)  12/25/18  266 lb 3.2 oz (120.7 kg)  11/13/18 268 lb (121.6 kg)     Objective:    Vital Signs:  Ht 6\' 2"  (1.88 m)   Wt 265 lb (120.2 kg)   BMI 34.02 kg/m    VITAL SIGNS:  reviewed  Pt could not take BP, on last visit with Dr. Everardo AllEllison 12/25/18 BP 118/78 and p 64  General  Male, in NAD Neuro A&O X 3, follows commands, answers questions approp.  Lungs can speak in complete sentences without SOB or wheezes Psych pleasant affect.      ASSESSMENT & PLAN:    Atrial flutter likely due to hyperthyroidism with substance abuse has maintained SR since TEE DCCV.  CHA2DS2VASc of 0 now and eliquis was stopped.  Cardiomyopathy resolved with normal EF, it was due to hyperthyroidism, and a fib RVR now off cozaar and aldactone and weaning off coreg.  Will decrease to 6.25 daily for 1 week then stip  Hyperthyroidism now normal and off meds.  Followed by Dr. Everardo AllEllison  Polysubstance abuse no longer using for 1 year.     COVID-19 Education: The signs and symptoms of COVID-19 were discussed with the patient and how to seek care for testing (follow up with PCP or arrange E-visit).  The importance of social distancing was discussed today.  Time:   Today, I have spent 10 minutes with the patient with telehealth technology discussing the above problems.     Medication Adjustments/Labs and Tests Ordered: Current medicines are reviewed at length with the patient today.  Concerns regarding medicines are outlined above.   Tests Ordered: No orders of the defined types were placed in this encounter.   Medication Changes: No orders of the defined types were placed in this encounter.   Disposition:  Follow up in 2 month(s)  Signed, Nada BoozerLaura , NP  01/21/2019 9:35 AM    [facility]

## 2019-01-21 ENCOUNTER — Other Ambulatory Visit: Payer: Self-pay

## 2019-01-21 ENCOUNTER — Ambulatory Visit: Payer: Managed Care, Other (non HMO) | Admitting: Cardiology

## 2019-01-21 ENCOUNTER — Telehealth (INDEPENDENT_AMBULATORY_CARE_PROVIDER_SITE_OTHER): Payer: Managed Care, Other (non HMO) | Admitting: Cardiology

## 2019-01-21 ENCOUNTER — Encounter: Payer: Self-pay | Admitting: Cardiology

## 2019-01-21 VITALS — Ht 74.0 in | Wt 265.0 lb

## 2019-01-21 DIAGNOSIS — I4892 Unspecified atrial flutter: Secondary | ICD-10-CM | POA: Diagnosis not present

## 2019-01-21 DIAGNOSIS — E0590 Thyrotoxicosis, unspecified without thyrotoxic crisis or storm: Secondary | ICD-10-CM

## 2019-01-21 DIAGNOSIS — F1910 Other psychoactive substance abuse, uncomplicated: Secondary | ICD-10-CM

## 2019-01-21 DIAGNOSIS — I429 Cardiomyopathy, unspecified: Secondary | ICD-10-CM

## 2019-01-21 MED ORDER — CARVEDILOL 6.25 MG PO TABS: 6.2500 mg | ORAL_TABLET | Freq: Two times a day (BID) | ORAL | 3 refills | Status: DC

## 2019-01-21 NOTE — Patient Instructions (Signed)
Medication Instructions:  Your physician has recommended you make the following change in your medication:  1.  DECREASE the Carvedilol to 6.25 taking 1 tablet twice a day.  A new prescription has been sent to [facility]  If you need a refill on your cardiac medications before your next appointment, please call your pharmacy.   Lab work: None ordered  If you have labs (blood work) drawn today and your tests are completely normal, you will receive your results only by: Marland Kitchen MyChart Message (if you have MyChart) OR . A paper copy in the mail If you have any lab test that is abnormal or we need to change your treatment, we will call you to review the results.  Testing/Procedures: None ordered  Follow-Up: At [facility], you and your health needs are our priority.  As part of our continuing mission to provide you with exceptional heart care, we have created designated Provider Care Teams.  These Care Teams include your primary Cardiologist (physician) and Advanced Practice Providers (APPs -  Physician Assistants and Nurse Practitioners) who all work together to provide you with the care you need, when you need it. . You will need a follow up appointment in 2 months.  YOU ARE SCHEDULED FOR ANOTHER VIRTUAL VISIT VIA VIDEO WITH DR. Eden Emms 03/18/2019 AT 10:20  Any Other Special Instructions Will Be Listed Below (If Applicable). Call our office if your heart rate or blood pressure increases

## 2019-02-04 ENCOUNTER — Other Ambulatory Visit: Payer: Self-pay

## 2019-02-04 ENCOUNTER — Ambulatory Visit (INDEPENDENT_AMBULATORY_CARE_PROVIDER_SITE_OTHER): Payer: Managed Care, Other (non HMO) | Admitting: Family Medicine

## 2019-02-04 ENCOUNTER — Encounter: Payer: Self-pay | Admitting: Family Medicine

## 2019-02-04 VITALS — BP 128/83 | HR 65 | Temp 98.3°F | Ht 74.0 in | Wt 265.6 lb

## 2019-02-04 DIAGNOSIS — Z8639 Personal history of other endocrine, nutritional and metabolic disease: Secondary | ICD-10-CM | POA: Diagnosis not present

## 2019-02-04 DIAGNOSIS — Z8679 Personal history of other diseases of the circulatory system: Secondary | ICD-10-CM | POA: Diagnosis not present

## 2019-02-04 NOTE — Progress Notes (Signed)
6/10/20209:41 AM  Logan Meadows [birth year], 38 y.o., male [id number]  Chief Complaint  Patient presents with  . Hyperthyroidism    no longer on the coreg, last spill was last week. Was told not to drink anything with caffeine due to the heart issue.   . Diarrhea    when taking the thyroid med, made him constipated    HPI:   Patient is a 38 y.o. male with past medical history significant for Atrial fib with cardiomyopathy in setting of graves disease and cocaine use, patient now s/p RAI who presents today for routine followup  Last OV March 2020 Saw endo April 2020, normal thyroid function, fu in 2 months Saw cards may 2020, stopped coreg, fu in 1 year  Patient is feeling better/fine now that he is off coreg Sleeping well, has good energy Has been back to drinking one cup of coffee a day Working on smaller portions Remains abstinent  Fall Risk  02/04/2019 11/07/2018 03/07/2018  Falls in the past year? 0 0 No  Number falls in past yr: 0 0 -  Injury with Fall? 0 0 -  Follow up - Falls evaluation completed -     Depression screen [facility] 2/9 02/04/2019 11/07/2018 03/07/2018  Decreased Interest 0 0 0  Down, Depressed, Hopeless 0 0 0  PHQ - 2 Score 0 0 0    No Known Allergies  Prior to Admission medications   Medication Sig Start Date End Date Taking? Authorizing Provider  acetaminophen (TYLENOL) 500 MG tablet Take 500 mg by mouth every 6 (six) hours as needed.   Yes [provider]  cetirizine (ZYRTEC) 10 MG tablet Take 10 mg by mouth daily.   Yes [provider]  fluticasone (FLONASE) 50 MCG/ACT nasal spray Place 2 sprays into both nostrils daily as needed for allergies or rhinitis.   Yes [provider]  carvedilol (COREG) 6.25 MG tablet Take 1 tablet (6.25 mg total) by mouth 2 (two) times daily. Patient not taking: Reported on 02/04/2019 01/21/19   Isaiah Serge, NP    Past Medical History:  Diagnosis Date  . Abnormal TSH 01/10/2018  .  Alcohol use   . Atrial flutter ([place]) 01/10/2018   a. dx 12/2017 in setting of hyperthyroidism, cocaine, alcohol, THC use.  . Cardiomyopathy ([place])    a. suspected tachy-mediated - EF 20-25% by echo and 30-35% by TEE 12/2017.  Marland Kitchen Cocaine use   . Graves disease   . Hyperthyroidism   . Marijuana use   . Seasonal allergies     Past Surgical History:  Procedure Laterality Date  . CARDIOVERSION N/A 01/17/2018   Procedure: CARDIOVERSION;  Surgeon: Josue Hector, MD;  Location: [facility] ENDOSCOPY;  Service: Cardiovascular;  Laterality: N/A;  . None    . TEE WITHOUT CARDIOVERSION N/A 01/17/2018   Procedure: TRANSESOPHAGEAL ECHOCARDIOGRAM (TEE);  Surgeon: Josue Hector, MD;  Location: [facility] ENDOSCOPY;  Service: Cardiovascular;  Laterality: N/A;    Social History   Tobacco Use  . Smoking status: Former Smoker    Packs/day: 0.25    Last attempt to quit: 01/19/2018    Years since quitting: 1.0  . Smokeless tobacco: Never Used  . Tobacco comment: 1 pack on the weekends  Substance Use Topics  . Alcohol use: Yes    Alcohol/week: 12.0 standard drinks    Types: 12 Cans of beer per week    Comment: 12 pack per weekend    Family History  Problem Relation Age of  Onset  . Diabetes Mother   . Diabetes Father   . Colon polyps Father   . Thyroid disease Neg Hx     Review of Systems  Constitutional: Negative for chills, fever and malaise/fatigue.  Respiratory: Negative for cough and shortness of breath.   Cardiovascular: Negative for chest pain, palpitations, orthopnea, leg swelling and PND.  Gastrointestinal: Positive for diarrhea (occ, when he drinks coffee). Negative for abdominal pain, constipation, nausea and vomiting.     OBJECTIVE:  Today's Vitals   02/04/19 0920  BP: 128/83  Pulse: 65  Temp: 98.3 F (36.8 C)  TempSrc: Oral  SpO2: 96%  Weight: 265 lb 9.6 oz (120.5 kg)  Height: 6\' 2"  (1.88 m)   Body mass index is 34.1 kg/m.   Wt Readings from Last 3 Encounters:  02/04/19 265 lb  9.6 oz (120.5 kg)  01/21/19 265 lb (120.2 kg)  12/25/18 266 lb 3.2 oz (120.7 kg)     Physical Exam Vitals signs and nursing note reviewed.  Constitutional:      Appearance: He is well-developed.  HENT:     Head: Normocephalic and atraumatic.  Eyes:     Conjunctiva/sclera: Conjunctivae normal.     Pupils: Pupils are equal, round, and reactive to light.  Neck:     Musculoskeletal: Neck supple.  Cardiovascular:     Rate and Rhythm: Normal rate and regular rhythm.     Heart sounds: No murmur. No friction rub. No gallop.   Pulmonary:     Effort: Pulmonary effort is normal.     Breath sounds: Normal breath sounds. No wheezing, rhonchi or rales.  Musculoskeletal:     Right lower leg: No edema.     Left lower leg: No edema.  Skin:    General: Skin is warm and dry.  Neurological:     Mental Status: He is alert and oriented to person, place, and time.     ASSESSMENT and PLAN  1. H/O Graves' disease Managed by endo. S/p RAI, currently euthyroid  2. H/O atrial fibrillation without current medication 3. H/O acquired cardiomyopathy Managed by cards. Resolved with resolution of hyperthyroidism and dc of cocaine use. Off all medications. Discussed ok 1 cup of coffee a day. RTC precautions given.   Return in about 1 year (around 02/04/2020) for CPE.    Myles Lipps, MD Primary Care at [facility] [address] [place], [place] [address] Ph.  [phone] Fax [phone]

## 2019-02-04 NOTE — Patient Instructions (Signed)
     If you have lab work done today you will be contacted with your lab results within the next 2 weeks.  If you have not heard from us then please contact us. The fastest way to get your results is to register for My Chart.   IF you received an x-ray today, you will receive an invoice from [facility]. Please contact [facility] at [phone] with questions or concerns regarding your invoice.   IF you received labwork today, you will receive an invoice from [facility]. Please contact [facility] at [phone] with questions or concerns regarding your invoice.   Our billing staff will not be able to assist you with questions regarding bills from these companies.  You will be contacted with the lab results as soon as they are available. The fastest way to get your results is to activate your My Chart account. Instructions are located on the last page of this paperwork. If you have not heard from us regarding the results in 2 weeks, please contact this office.        If you have lab work done today you will be contacted with your lab results within the next 2 weeks.  If you have not heard from us then please contact us. The fastest way to get your results is to register for My Chart.   IF you received an x-ray today, you will receive an invoice from [facility]. Please contact  Radiology at [phone] with questions or concerns regarding your invoice.   IF you received labwork today, you will receive an invoice from [facility]. Please contact [facility] at [phone] with questions or concerns regarding your invoice.   Our billing staff will not be able to assist you with questions regarding bills from these companies.  You will be contacted with the lab results as soon as they are available. The fastest way to get your results is to activate your My Chart account. Instructions are located on the last page of this paperwork. If you have not heard from us  regarding the results in 2 weeks, please contact this office.     

## 2019-02-24 ENCOUNTER — Other Ambulatory Visit: Payer: Self-pay

## 2019-02-26 ENCOUNTER — Encounter: Payer: Self-pay | Admitting: Endocrinology

## 2019-02-26 ENCOUNTER — Ambulatory Visit (INDEPENDENT_AMBULATORY_CARE_PROVIDER_SITE_OTHER): Payer: Managed Care, Other (non HMO) | Admitting: Endocrinology

## 2019-02-26 ENCOUNTER — Other Ambulatory Visit: Payer: Self-pay

## 2019-02-26 VITALS — BP 128/88 | HR 72 | Ht 74.0 in | Wt 264.0 lb

## 2019-02-26 DIAGNOSIS — E0590 Thyrotoxicosis, unspecified without thyrotoxic crisis or storm: Secondary | ICD-10-CM | POA: Diagnosis not present

## 2019-02-26 LAB — T4, FREE: Free T4: 0.64 ng/dL (ref 0.60–1.60)

## 2019-02-26 LAB — TSH: TSH: 4.13 u[IU]/mL (ref 0.35–4.50)

## 2019-02-26 NOTE — Patient Instructions (Signed)
Blood tests are requested for you today.  We'll let you know about the results.   Please come back for a follow-up appointment in 3-4 months.   

## 2019-02-26 NOTE — Progress Notes (Signed)
Subjective:    Patient ID: Logan Meadows, male    DOB: 1980/10/05, 38 y.o.   MRN: [id number]  HPI Pt returns for f/u of hyperthyroidism (dx'ed mid-2019, when he was in the hospital with AF, but he had also used cocaine just prior to admission; he was initially rx'ed Tapazole and Coreg; nuc med scan was c/w Grave's Dz; he then took RAI 12/19).  pt states he feels well in general.   Past Medical History:  Diagnosis Date  . Abnormal TSH 01/10/2018  . Alcohol use   . Atrial flutter ([place]) 01/10/2018   a. dx 12/2017 in setting of hyperthyroidism, cocaine, alcohol, THC use.  . Cardiomyopathy ([place])    a. suspected tachy-mediated - EF 20-25% by echo and 30-35% by TEE 12/2017.  Marland Kitchen Cocaine use   . Graves disease   . Hyperthyroidism   . Marijuana use   . Seasonal allergies     Past Surgical History:  Procedure Laterality Date  . CARDIOVERSION N/A 01/17/2018   Procedure: CARDIOVERSION;  Surgeon: Josue Hector, MD;  Location: [facility] ENDOSCOPY;  Service: Cardiovascular;  Laterality: N/A;  . None    . TEE WITHOUT CARDIOVERSION N/A 01/17/2018   Procedure: TRANSESOPHAGEAL ECHOCARDIOGRAM (TEE);  Surgeon: Josue Hector, MD;  Location: [facility] ENDOSCOPY;  Service: Cardiovascular;  Laterality: N/A;    Social History   Socioeconomic History  . Marital status: Single    Spouse name: Not on file  . Number of children: 0  . Years of education: Not on file  . Highest education level: Not on file  Occupational History  . Occupation: Therapist, nutritional person    Employer: [employer]  Social Needs  . Financial resource strain: Not on file  . Food insecurity    Worry: Not on file    Inability: Not on file  . Transportation needs    Medical: Not on file    Non-medical: Not on file  Tobacco Use  . Smoking status: Former Smoker    Packs/day: 0.25    Quit date: 01/19/2018    Years since quitting: 1.1  . Smokeless tobacco: Never Used  . Tobacco comment: 1 pack on the weekends  Substance and Sexual Activity  .  Alcohol use: Yes    Alcohol/week: 12.0 standard drinks    Types: 12 Cans of beer per week    Comment: 12 pack per weekend  . Drug use: Not Currently    Frequency: 7.0 times per week    Types: Marijuana, Cocaine    Comment: daily marijuana, cocaine on weekends  . Sexual activity: Yes  Lifestyle  . Physical activity    Days per week: Not on file    Minutes per session: Not on file  . Stress: Not on file  Relationships  . Social Herbalist on phone: Not on file    Gets together: Not on file    Attends religious service: Not on file    Active member of club or organization: Not on file    Attends meetings of clubs or organizations: Not on file    Relationship status: Not on file  . Intimate partner violence    Fear of current or ex partner: Not on file    Emotionally abused: Not on file    Physically abused: Not on file    Forced sexual activity: Not on file  Other Topics Concern  . Not on file  Social History Narrative   Pt lives with his mother.  Current Outpatient Medications on File Prior to Visit  Medication Sig Dispense Refill  . acetaminophen (TYLENOL) 500 MG tablet Take 500 mg by mouth every 6 (six) hours as needed.    . cetirizine (ZYRTEC) 10 MG tablet Take 10 mg by mouth daily.    . fluticasone (FLONASE) 50 MCG/ACT nasal spray Place 2 sprays into both nostrils daily as needed for allergies or rhinitis.     No current facility-administered medications on file prior to visit.     No Known Allergies  Family History  Problem Relation Age of Onset  . Diabetes Mother   . Diabetes Father   . Colon polyps Father   . Thyroid disease Neg Hx     BP 128/88 (BP Location: Left Arm, Patient Position: Sitting, Cuff Size: Large)   Pulse 72   Ht 6\' 2"  (1.88 m)   Wt 264 lb (119.7 kg)   SpO2 95%   BMI 33.90 kg/m    Review of Systems Denies neck swelling.      Objective:   Physical Exam VITAL SIGNS:  See vs page.   GENERAL: no distress.   NECK: There  is no palpable thyroid enlargement.  No thyroid nodule is palpable.  No palpable lymphadenopathy at the anterior neck.     Lab Results  Component Value Date   TSH 4.13 02/26/2019   T4TOTAL 5.4 03/07/2018      Assessment & Plan:  Hyperthyroidism: resolved with RAI rx. I told pt that normal TFT are seldom permanent in the setting of RAI rx.  Drug abuse. Although this is resolved, he should maintain euthyroidism.   Patient Instructions  Blood tests are requested for you today.  We'll let you know about the results.  Please come back for a follow-up appointment in 3-4 months.

## 2019-03-18 ENCOUNTER — Telehealth: Payer: Managed Care, Other (non HMO) | Admitting: Cardiovascular Disease

## 2019-04-06 NOTE — Progress Notes (Signed)
Cardiology Office Note   Date:  04/07/2019   ID:  Logan Meadows, DOB Mar 17, 1981, MRN [id number]  PCP:  Logan Lipps, MD  Cardiologist:  Dr. Eden Meadows    Chief Complaint  Patient presents with  . Atrial Flutter      History of Present Illness: Logan Meadows is a 38 y.o. male who presents for eval off his coreg.  He has a hx of a fib, cardiomyopathy and hyperthyroid. He has a history of substance abuse THC, cocaine and smoking First seen [facility] EF May 2019 with EF 25-30% rapid afib and very hyperthyroid. TEE / [facility] done due to hard to control rate and CHF. EF 30-35% on TEE Placed on beta blocker and methimazole Last Free T4 05/15/18 .68 normal range TSH also normal at 3.77 . Dr Logan Meadows wanted him to stopMethimazole and take radioactive iodine pill   F/U TTE done 06/25/18 reviewed EF 55-60% mild LAE   On last visit  "Feels better no cardiac symptoms Reviewed note by Dr Logan Meadows 09/02/18  And indicated worse off Tapazole but RAI "will work soon"Free T4 normal 0.86 10/09/18 with TSH still suppressed 0.10 on 10/09/18"  Discussed continuing coreg / eliquis until TSH normal off tapazole Then weaning coreg to daily for a couple weeks  TSH/T4 now normal discussed tapering and stopping coreg and ok To d/c eliquis todaywith normal EF and CHA2DS2VASc of 0.   On tele health in May  He no longer smokes - has done nothing for a year.  last TSH on 12/25/18  3.20 and Free T4 of 0.76.  No longer on meds for thyroid. we stopped his coreg after weaning, eilquis stopped. .    Today no chest pain and no SOB.  No lightheadedness or dizziness. No palpitations.  Feels well.  Continues to work, with temp taking and masks.    Past Medical History:  Diagnosis Date  . Abnormal TSH 01/10/2018  . Alcohol use   . Atrial flutter ([facility]) 01/10/2018   a. dx 12/2017 in setting of hyperthyroidism, cocaine, alcohol, THC use.  . Cardiomyopathy ([facility])    a. suspected tachy-mediated - EF 20-25% by echo and  30-35% by TEE 12/2017.  Marland Kitchen Cocaine use   . Graves disease   . Hyperthyroidism   . Marijuana use   . Seasonal allergies     Past Surgical History:  Procedure Laterality Date  . CARDIOVERSION N/A 01/17/2018   Procedure: CARDIOVERSION;  Surgeon: Logan Stade, MD;  [facility]COPY;  Service: Cardiovascular;  Laterality: N/A;  . None    . TEE WITHOUT CARDIOVERSION N/A 01/17/2018   Procedure: TRANSESOPHAGEAL ECHOCARDIOGRAM (TEE);  Surgeon: Logan Stade, MD;  Location: [facility] ENDOSCOPY;  Service: Cardiovascular;  Laterality: N/A;     Current Outpatient Medications  Medication Sig Dispense Refill  . acetaminophen (TYLENOL) 500 MG tablet Take 500 mg by mouth every 6 (six) hours as needed.    . cetirizine (ZYRTEC) 10 MG tablet Take 10 mg by mouth daily.    . fluticasone (FLONASE) 50 MCG/ACT nasal spray Place 2 sprays into both nostrils daily as needed for allergies or rhinitis.     No current facility-administered medications for this visit.     Allergies:   Patient has no known allergies.    Social History:  The patient  reports that he quit smoking about 14 months ago. He smoked 0.25 packs per day. He has never used smokeless tobacco. He reports current alcohol use of about 12.0 standard  drinks of alcohol per week. He reports previous drug use. Frequency: 7.00 times per week. Drugs: Marijuana and Cocaine.   Family History:  The patient's family history includes Colon polyps in his father; Diabetes in his father and mother.    ROS:  General:no colds or fevers, no weight changes Skin:no rashes or ulcers HEENT:no blurred vision, no congestion CV:see HPI PUL:see HPI GI:no diarrhea constipation or melena, no indigestion GU:no hematuria, no dysuria MS:no joint pain, no claudication Neuro:no syncope, no lightheadedness Endo:no diabetes, treated thyroid disease  Wt Readings from Last 3 Encounters:  04/07/19 259 lb 1.9 oz (117.5 kg)  02/26/19 264 lb (119.7 kg)  02/04/19 265 lb  9.6 oz (120.5 kg)     PHYSICAL EXAM: VS:  BP 122/80   Pulse 64   Ht 6\' 2"  (1.88 m)   Wt 259 lb 1.9 oz (117.5 kg)   SpO2 97%   BMI 33.27 kg/m  , BMI Body mass index is 33.27 kg/m. General:Pleasant affect, NAD Skin:Warm and dry, brisk capillary refill HEENT:normocephalic, sclera clear, mucus membranes moist Neck:supple, no JVD, no bruits  Heart:S1S2 RRR without murmur, gallup, rub or click Lungs:clear without rales, rhonchi, or wheezes GHW:EXHB, non tender, + BS, do not palpate liver spleen or masses Ext:no lower ext edema, 2+ pedal pulses, 2+ radial pulses Neuro:alert and oriented, MAE, follows commands, + facial symmetry    EKG:  EKG is ordered today. The ekg ordered today demonstrates SR LVH, inverted T wave in III due to LVH.    Recent Labs: 02/26/2019: TSH 4.13    Lipid Panel    Component Value Date/Time   CHOL 108 01/12/2018 0319   TRIG 47 01/12/2018 0319   HDL 32 (L) 01/12/2018 0319   CHOLHDL 3.4 01/12/2018 0319   VLDL 9 01/12/2018 0319   LDLCALC 67 01/12/2018 0319       Other studies Reviewed: Additional studies/ records that were reviewed today include: . Echo 06/25/18 Study Conclusions  - Left ventricle: The cavity size was normal. Systolic function was   normal. The estimated ejection fraction was in the range of 55%   to 60%. Wall motion was normal; there were no regional wall   motion abnormalities. Left ventricular diastolic function   parameters were normal. - Left atrium: The atrium was mildly dilated. - Atrial septum: No defect or patent foramen ovale was identified.   ASSESSMENT AND PLAN:  1.  Atrial flutter likely due to hyperthyroidism with substance abuse- no longer uses.   Has maintained SR since TEE/DCCV.  With chadsvasc of 0 eliquis was topped and he has been weaned off Coreg.  Follow up with Dr. Johnsie Meadows in 6 months - he will call if palpitations or SOb.   2.  Cardiomyopathy resolved with normal EF again due to hyperthyroidism.  Off  cozaar and aldactone.   3.  Hyperthyroidism followed by Dr. Loanne Meadows - follow up in Sept   Current medicines are reviewed with the patient today.  The patient Has no concerns regarding medicines.  The following changes have been made:  See above Labs/ tests ordered today include:see above  Disposition:   FU:  see above  Signed, Cecilie Kicks, NP  04/07/2019 12:07 PM    [address], [place], [place], [place] Phone: [phone]; Fax: [phone]

## 2019-04-07 ENCOUNTER — Other Ambulatory Visit: Payer: Self-pay

## 2019-04-07 ENCOUNTER — Encounter: Payer: Self-pay | Admitting: Cardiology

## 2019-04-07 ENCOUNTER — Ambulatory Visit (INDEPENDENT_AMBULATORY_CARE_PROVIDER_SITE_OTHER): Payer: Managed Care, Other (non HMO) | Admitting: Cardiology

## 2019-04-07 VITALS — BP 122/80 | HR 64 | Ht 74.0 in | Wt 259.1 lb

## 2019-04-07 DIAGNOSIS — I429 Cardiomyopathy, unspecified: Secondary | ICD-10-CM | POA: Diagnosis not present

## 2019-04-07 DIAGNOSIS — I4892 Unspecified atrial flutter: Secondary | ICD-10-CM

## 2019-04-07 DIAGNOSIS — E0590 Thyrotoxicosis, unspecified without thyrotoxic crisis or storm: Secondary | ICD-10-CM

## 2019-04-07 NOTE — Patient Instructions (Signed)
Medication Instructions:  Your physician recommends that you continue on your current medications as directed. Please refer to the Current Medication list given to you today.  If you need a refill on your cardiac medications before your next appointment, please call your pharmacy.   Lab work: None ordered  If you have labs (blood work) drawn today and your tests are completely normal, you will receive your results only by: . MyChart Message (if you have MyChart) OR . A paper copy in the mail If you have any lab test that is abnormal or we need to change your treatment, we will call you to review the results.  Testing/Procedures: None ordered  Follow-Up: At [facility], you and your health needs are our priority.  As part of our continuing mission to provide you with exceptional heart care, we have created designated Provider Care Teams.  These Care Teams include your primary Cardiologist (physician) and Advanced Practice Providers (APPs -  Physician Assistants and Nurse Practitioners) who all work together to provide you with the care you need, when you need it. You will need a follow up appointment in 6 months.  Please call our office 2 months in advance to schedule this appointment.  You may see Peter Nishan, MD or one of the following Advanced Practice Providers on your designated Care Team:   Lori Gerhardt, NP Laura Ingold, NP . Jill McDaniel, NP  Any Other Special Instructions Will Be Listed Below (If Applicable).    

## 2019-04-07 NOTE — Addendum Note (Signed)
Addended by: Jeremy Johann on: 04/07/2019 12:29 PM   Modules accepted: Orders

## 2019-06-25 ENCOUNTER — Other Ambulatory Visit: Payer: Self-pay

## 2019-06-29 ENCOUNTER — Other Ambulatory Visit: Payer: Self-pay

## 2019-06-29 ENCOUNTER — Ambulatory Visit (INDEPENDENT_AMBULATORY_CARE_PROVIDER_SITE_OTHER): Payer: Managed Care, Other (non HMO) | Admitting: Endocrinology

## 2019-06-29 ENCOUNTER — Encounter: Payer: Self-pay | Admitting: Endocrinology

## 2019-06-29 VITALS — BP 120/82 | HR 70 | Ht 74.0 in | Wt 266.4 lb

## 2019-06-29 DIAGNOSIS — E0590 Thyrotoxicosis, unspecified without thyrotoxic crisis or storm: Secondary | ICD-10-CM

## 2019-06-29 LAB — T4, FREE: Free T4: 0.76 ng/dL (ref 0.60–1.60)

## 2019-06-29 LAB — TSH: TSH: 3.1 u[IU]/mL (ref 0.35–4.50)

## 2019-06-29 NOTE — Progress Notes (Signed)
Subjective:    Patient ID: Logan Meadows, male    DOB: 1981/01/26, 38 y.o.   MRN: [id number]  HPI Pt returns for f/u of hyperthyroidism (dx'ed mid-2019, when he was in the hospital with AF, but he had also used cocaine just prior to admission; he was initially rx'ed Tapazole and Coreg; nuc med scan was c/w Grave's Dz; he then took RAI 12/19).  pt states he feels well in general.   Past Medical History:  Diagnosis Date  . Abnormal TSH 01/10/2018  . Alcohol use   . Atrial flutter (HCC) 01/10/2018   a. dx 12/2017 in setting of hyperthyroidism, cocaine, alcohol, THC use.  . Cardiomyopathy (HCC)    a. suspected tachy-mediated - EF 20-25% by echo and 30-35% by TEE 12/2017.  Marland Kitchen Cocaine use   . Graves disease   . Hyperthyroidism   . Marijuana use   . Seasonal allergies     Past Surgical History:  Procedure Laterality Date  . CARDIOVERSION N/A 01/17/2018   Procedure: CARDIOVERSION;  Surgeon: Wendall Stade, MD;  Location: [facility] ENDOSCOPY;  Service: Cardiovascular;  Laterality: N/A;  . None    . TEE WITHOUT CARDIOVERSION N/A 01/17/2018   Procedure: TRANSESOPHAGEAL ECHOCARDIOGRAM (TEE);  Surgeon: Wendall Stade, MD;  Location: [facility] ENDOSCOPY;  Service: Cardiovascular;  Laterality: N/A;    Social History   Socioeconomic History  . Marital status: Single    Spouse name: Not on file  . Number of children: 0  . Years of education: Not on file  . Highest education level: Not on file  Occupational History  . Occupation: Sports administrator person    Employer: [employer]  Social Needs  . Financial resource strain: Not on file  . Food insecurity    Worry: Not on file    Inability: Not on file  . Transportation needs    Medical: Not on file    Non-medical: Not on file  Tobacco Use  . Smoking status: Former Smoker    Packs/day: 0.25    Quit date: 01/19/2018    Years since quitting: 1.4  . Smokeless tobacco: Never Used  . Tobacco comment: 1 pack on the weekends  Substance and Sexual Activity  .  Alcohol use: Yes    Alcohol/week: 12.0 standard drinks    Types: 12 Cans of beer per week    Comment: 12 pack per weekend  . Drug use: Not Currently    Frequency: 7.0 times per week    Types: Marijuana, Cocaine    Comment: daily marijuana, cocaine on weekends  . Sexual activity: Yes  Lifestyle  . Physical activity    Days per week: Not on file    Minutes per session: Not on file  . Stress: Not on file  Relationships  . Social Musician on phone: Not on file    Gets together: Not on file    Attends religious service: Not on file    Active member of club or organization: Not on file    Attends meetings of clubs or organizations: Not on file    Relationship status: Not on file  . Intimate partner violence    Fear of current or ex partner: Not on file    Emotionally abused: Not on file    Physically abused: Not on file    Forced sexual activity: Not on file  Other Topics Concern  . Not on file  Social History Narrative   Pt lives with his mother.  Current Outpatient Medications on File Prior to Visit  Medication Sig Dispense Refill  . acetaminophen (TYLENOL) 500 MG tablet Take 500 mg by mouth every 6 (six) hours as needed.    . cetirizine (ZYRTEC) 10 MG tablet Take 10 mg by mouth daily.    . fluticasone (FLONASE) 50 MCG/ACT nasal spray Place 2 sprays into both nostrils daily as needed for allergies or rhinitis.     No current facility-administered medications on file prior to visit.     No Known Allergies  Family History  Problem Relation Age of Onset  . Diabetes Mother   . Diabetes Father   . Colon polyps Father   . Thyroid disease Neg Hx     BP 120/82 (BP Location: Left Arm, Patient Position: Sitting, Cuff Size: Large)   Pulse 70   Ht 6\' 2"  (1.88 m)   Wt 266 lb 6.4 oz (120.8 kg)   SpO2 91%   BMI 34.20 kg/m    Review of Systems he denies palpitations and tremor     Objective:   Physical Exam VITAL SIGNS:  See vs page GENERAL: no distress  NECK: There is no palpable thyroid enlargement.  No thyroid nodule is palpable.  No palpable lymphadenopathy at the anterior neck.    Lab Results  Component Value Date   TSH 3.10 06/29/2019   T4TOTAL 5.4 03/07/2018      Assessment & Plan:  Hyperthyroidism: well-controlled by RAI Grave's Dz: I told pt that euthyroidism is unlikely to persist indefinitely.   Patient Instructions  Blood tests are requested for you today.  We'll let you know about the results.  Please come back for a follow-up appointment in 3-4 months.

## 2019-06-29 NOTE — Patient Instructions (Addendum)
Blood tests are requested for you today.  We'll let you know about the results.   Please come back for a follow-up appointment in 3-4 months.   

## 2019-10-26 ENCOUNTER — Other Ambulatory Visit: Payer: Self-pay

## 2019-10-28 ENCOUNTER — Encounter: Payer: Self-pay | Admitting: Endocrinology

## 2019-10-28 ENCOUNTER — Ambulatory Visit (INDEPENDENT_AMBULATORY_CARE_PROVIDER_SITE_OTHER): Payer: Managed Care, Other (non HMO) | Admitting: Endocrinology

## 2019-10-28 ENCOUNTER — Other Ambulatory Visit: Payer: Self-pay

## 2019-10-28 VITALS — BP 124/80 | HR 67 | Ht 74.0 in | Wt 268.2 lb

## 2019-10-28 DIAGNOSIS — E0590 Thyrotoxicosis, unspecified without thyrotoxic crisis or storm: Secondary | ICD-10-CM

## 2019-10-28 LAB — T4, FREE: Free T4: 0.81 ng/dL (ref 0.60–1.60)

## 2019-10-28 LAB — TSH: TSH: 2.53 u[IU]/mL (ref 0.35–4.50)

## 2019-10-28 NOTE — Patient Instructions (Signed)
Blood tests are requested for you today.  We'll let you know about the results.  With time, the thyroid will become overactive again, or underactive, so we'll follow this Please come back for a follow-up appointment in 6 months.

## 2019-10-28 NOTE — Progress Notes (Signed)
Subjective:    Patient ID: Logan Meadows, male    DOB: 04-30-1981, 39 y.o.   MRN: [id number]  HPI Pt returns for f/u of hyperthyroidism (dx'ed mid-2019, when he was in the hospital with AF, but he had also used cocaine just prior to admission; he was initially rx'ed Tapazole and Coreg; nuc med scan was c/w Grave's Dz; he then took RAI 12/19).  pt states he feels well in general.  Past Medical History:  Diagnosis Date  . Abnormal TSH 01/10/2018  . Alcohol use   . Atrial flutter (HCC) 01/10/2018   a. dx 12/2017 in setting of hyperthyroidism, cocaine, alcohol, THC use.  . Cardiomyopathy (HCC)    a. suspected tachy-mediated - EF 20-25% by echo and 30-35% by TEE 12/2017.  Marland Kitchen Cocaine use   . Graves disease   . Hyperthyroidism   . Marijuana use   . Seasonal allergies     Past Surgical History:  Procedure Laterality Date  . CARDIOVERSION N/A 01/17/2018   Procedure: CARDIOVERSION;  Surgeon: Wendall Stade, MD;  Location: [facility] ENDOSCOPY;  Service: Cardiovascular;  Laterality: N/A;  . None    . TEE WITHOUT CARDIOVERSION N/A 01/17/2018   Procedure: TRANSESOPHAGEAL ECHOCARDIOGRAM (TEE);  Surgeon: Wendall Stade, MD;  Location: [facility] ENDOSCOPY;  Service: Cardiovascular;  Laterality: N/A;    Social History   Socioeconomic History  . Marital status: Single    Spouse name: Not on file  . Number of children: 0  . Years of education: Not on file  . Highest education level: Not on file  Occupational History  . Occupation: Fulfillment person    Employer: [employer]  Tobacco Use  . Smoking status: Former Smoker    Packs/day: 0.25    Quit date: 01/19/2018    Years since quitting: 1.7  . Smokeless tobacco: Never Used  . Tobacco comment: 1 pack on the weekends  Substance and Sexual Activity  . Alcohol use: Yes    Alcohol/week: 12.0 standard drinks    Types: 12 Cans of beer per week    Comment: 12 pack per weekend  . Drug use: Not Currently    Frequency: 7.0 times per week    Types: Marijuana,  Cocaine    Comment: daily marijuana, cocaine on weekends  . Sexual activity: Yes  Other Topics Concern  . Not on file  Social History Narrative   Pt lives with his mother.    Social Determinants of Health   Financial Resource Strain:   . Difficulty of Paying Living Expenses: Not on file  Food Insecurity:   . Worried About Programme researcher, broadcasting/film/video in the Last Year: Not on file  . Ran Out of Food in the Last Year: Not on file  Transportation Needs:   . Lack of Transportation (Medical): Not on file  . Lack of Transportation (Non-Medical): Not on file  Physical Activity:   . Days of Exercise per Week: Not on file  . Minutes of Exercise per Session: Not on file  Stress:   . Feeling of Stress : Not on file  Social Connections:   . Frequency of Communication with Friends and Family: Not on file  . Frequency of Social Gatherings with Friends and Family: Not on file  . Attends Religious Services: Not on file  . Active Member of Clubs or Organizations: Not on file  . Attends Banker Meetings: Not on file  . Marital Status: Not on file  Intimate Partner Violence:   .  Fear of Current or Ex-Partner: Not on file  . Emotionally Abused: Not on file  . Physically Abused: Not on file  . Sexually Abused: Not on file    Current Outpatient Medications on File Prior to Visit  Medication Sig Dispense Refill  . acetaminophen (TYLENOL) 500 MG tablet Take 500 mg by mouth every 6 (six) hours as needed.    . cetirizine (ZYRTEC) 10 MG tablet Take 10 mg by mouth daily.    . fluticasone (FLONASE) 50 MCG/ACT nasal spray Place 2 sprays into both nostrils daily as needed for allergies or rhinitis.     No current facility-administered medications on file prior to visit.    No Known Allergies  Family History  Problem Relation Age of Onset  . Diabetes Mother   . Diabetes Father   . Colon polyps Father   . Thyroid disease Neg Hx     BP 124/80 (BP Location: Left Arm, Patient Position:  Sitting, Cuff Size: Large)   Pulse 67   Ht 6\' 2"  (1.88 m)   Wt 268 lb 3.2 oz (121.7 kg)   SpO2 99%   BMI 34.43 kg/m   Review of Systems he denies palpitations and tremor    Objective:   Physical Exam VITAL SIGNS:  See vs page GENERAL: no distress NECK: There is no palpable thyroid enlargement.  No thyroid nodule is palpable.  No palpable lymphadenopathy at the anterior neck.  Lab Results  Component Value Date   TSH 2.53 10/28/2019   T4TOTAL 5.4 03/07/2018      Assessment & Plan:  Post-RAI hypothyroidism: well-replaced.   Grave's Dz: he needs less than a full replacement dosage, so he is at risk for recurrence of hyperthyroidism.   Patient Instructions  Blood tests are requested for you today.  We'll let you know about the results.  With time, the thyroid will become overactive again, or underactive, so we'll follow this Please come back for a follow-up appointment in 6 months.

## 2019-12-07 ENCOUNTER — Ambulatory Visit: Payer: Managed Care, Other (non HMO)

## 2020-02-08 ENCOUNTER — Other Ambulatory Visit (HOSPITAL_COMMUNITY)
Admission: RE | Admit: 2020-02-08 | Discharge: 2020-02-08 | Disposition: A | Discharge status: Home / Self Care | Payer: Managed Care, Other (non HMO) | Source: Ambulatory Visit | Attending: Family Medicine | Admitting: Family Medicine

## 2020-02-08 ENCOUNTER — Telehealth: Payer: Self-pay | Admitting: Family Medicine

## 2020-02-08 ENCOUNTER — Other Ambulatory Visit: Payer: Self-pay

## 2020-02-08 ENCOUNTER — Ambulatory Visit (INDEPENDENT_AMBULATORY_CARE_PROVIDER_SITE_OTHER): Payer: Managed Care, Other (non HMO) | Admitting: Family Medicine

## 2020-02-08 ENCOUNTER — Encounter: Payer: Self-pay | Admitting: Family Medicine

## 2020-02-08 VITALS — BP 133/84 | HR 60 | Temp 97.6°F | Ht 74.0 in | Wt 265.4 lb

## 2020-02-08 DIAGNOSIS — Z13228 Encounter for screening for other metabolic disorders: Secondary | ICD-10-CM

## 2020-02-08 DIAGNOSIS — Z113 Encounter for screening for infections with a predominantly sexual mode of transmission: Secondary | ICD-10-CM | POA: Insufficient documentation

## 2020-02-08 DIAGNOSIS — Z13220 Encounter for screening for lipoid disorders: Secondary | ICD-10-CM

## 2020-02-08 DIAGNOSIS — Z0000 Encounter for general adult medical examination without abnormal findings: Secondary | ICD-10-CM

## 2020-02-08 NOTE — Progress Notes (Signed)
6/14/20218:21 AM  Logan Meadows 1981-01-11, 39 y.o., male [id number]  Chief Complaint  Patient presents with  . Annual Exam    HPI:   Patient is a 39 y.o. male with past medical history significant for Atrial fib with cardiomyopathy in setting of graves disease and cocaine use, patient now s/p RAI who presents today for CPE  Colorectal Cancer Screening: at age 40 Prostate Cancer Screening: at age 81 HIV Screening: will due today STI Screening: will due today Seasonal Influenza Vaccination: seasonally Td/Tdap Vaccination: 2019 Pneumococcal Vaccination: at age 70 Zoster Vaccination: at age 36 Reports has completed covid vaccine Frequency of Dental evaluation: needs to make appt Frequency of Eye evaluation: wears eyeglasses, has to make appt  Quit smoking and drug use ~ 2019 Hardly ever drinks etoh  Fasting today   Hearing Screening   '125Hz'$  '250Hz'$  '500Hz'$  '1000Hz'$  '2000Hz'$  '3000Hz'$  '4000Hz'$  '6000Hz'$  '8000Hz'$   Right ear:           Left ear:             Visual Acuity Screening   Right eye Left eye Both eyes  Without correction:     With correction: '20/20 20/20 20/15 '$    Depression screen [facility] 2/9 02/04/2019 11/07/2018 03/07/2018  Decreased Interest 0 0 0  Down, Depressed, Hopeless 0 0 0  PHQ - 2 Score 0 0 0    Fall Risk  02/08/2020 02/04/2019 11/07/2018 03/07/2018  Falls in the past year? 0 0 0 No  Number falls in past yr: 0 0 0 -  Injury with Fall? 0 0 0 -  Follow up - - Falls evaluation completed -     No Known Allergies  Prior to Admission medications   Medication Sig Start Date End Date Taking? Authorizing Provider  acetaminophen (TYLENOL) 500 MG tablet Take 500 mg by mouth every 6 (six) hours as needed.   Yes [provider]  cetirizine (ZYRTEC) 10 MG tablet Take 10 mg by mouth daily.   Yes [provider]  fluticasone (FLONASE) 50 MCG/ACT nasal spray Place 2 sprays into both nostrils daily as needed for allergies or rhinitis.   Yes [provider]    Past Medical History:  Diagnosis Date  . Abnormal TSH 01/10/2018  . Alcohol use   . Atrial flutter ([place]) 01/10/2018   a. dx 12/2017 in setting of hyperthyroidism, cocaine, alcohol, THC use.  . Cardiomyopathy ([place])    a. suspected tachy-mediated - EF 20-25% by echo and 30-35% by TEE 12/2017.  Marland Kitchen Cocaine use   . Graves disease   . Hyperthyroidism   . Marijuana use   . Seasonal allergies   . Thyroid disease    Phreesia 02/07/2020    Past Surgical History:  Procedure Laterality Date  . CARDIOVERSION N/A 01/17/2018   Procedure: CARDIOVERSION;  Surgeon: Josue Hector, MD;  Location: [facility] ENDOSCOPY;  Service: Cardiovascular;  Laterality: N/A;  . None    . TEE WITHOUT CARDIOVERSION N/A 01/17/2018   Procedure: TRANSESOPHAGEAL ECHOCARDIOGRAM (TEE);  Surgeon: Josue Hector, MD;  Location: [facility] ENDOSCOPY;  Service: Cardiovascular;  Laterality: N/A;    Social History   Tobacco Use  . Smoking status: Former Smoker    Packs/day: 0.25    Quit date: 01/19/2018    Years since quitting: 2.0  . Smokeless tobacco: Never Used  . Tobacco comment: 1 pack on the weekends  Substance Use Topics  . Alcohol use: Yes    Alcohol/week: 12.0 standard drinks  Types: 12 Cans of beer per week    Comment: 12 pack per weekend    Family History  Problem Relation Age of Onset  . Diabetes Mother   . Diabetes Father   . Colon polyps Father   . Thyroid disease Neg Hx     Review of Systems  Constitutional: Negative for chills and fever.  Respiratory: Negative for cough and shortness of breath.   Cardiovascular: Negative for chest pain, palpitations, orthopnea, leg swelling and PND.  Gastrointestinal: Positive for constipation (minor, manages with stool softner). Negative for abdominal pain, nausea and vomiting.  All other systems reviewed and are negative.    OBJECTIVE:  Today's Vitals   02/08/20 0817  BP: 133/84  Pulse: 60  Temp: 97.6 F (36.4 C)  SpO2: 100%  Weight: 265 lb  6.4 oz (120.4 kg)  Height: '6\' 2"'$  (1.88 m)   Body mass index is 34.08 kg/m.   Wt Readings from Last 3 Encounters:  02/08/20 265 lb 6.4 oz (120.4 kg)  10/28/19 268 lb 3.2 oz (121.7 kg)  06/29/19 266 lb 6.4 oz (120.8 kg)     Physical Exam Vitals and nursing note reviewed.  Constitutional:      Appearance: He is well-developed.  HENT:     Head: Normocephalic and atraumatic.     Right Ear: Hearing, tympanic membrane, ear canal and external ear normal.     Left Ear: Hearing, tympanic membrane, ear canal and external ear normal.     Mouth/Throat:     Pharynx: No oropharyngeal exudate.  Eyes:     Conjunctiva/sclera: Conjunctivae normal.     Pupils: Pupils are equal, round, and reactive to light.  Neck:     Thyroid: No thyromegaly.  Cardiovascular:     Rate and Rhythm: Normal rate and regular rhythm.     Heart sounds: Normal heart sounds. No murmur heard.  No friction rub. No gallop.   Pulmonary:     Effort: Pulmonary effort is normal.     Breath sounds: Normal breath sounds. No wheezing, rhonchi or rales.  Abdominal:     General: Bowel sounds are normal. There is no distension.     Palpations: Abdomen is soft. There is no mass.     Tenderness: There is no abdominal tenderness.  Musculoskeletal:        General: Normal range of motion.     Cervical back: Neck supple.  Lymphadenopathy:     Cervical: No cervical adenopathy.  Skin:    General: Skin is warm and dry.  Neurological:     Mental Status: He is alert and oriented to person, place, and time.     Cranial Nerves: No cranial nerve deficit.     Coordination: Coordination normal.     Gait: Gait normal.     Deep Tendon Reflexes: Reflexes are normal and symmetric.     No results found for this or any previous visit (from the past 24 hour(s)).  No results found.   ASSESSMENT and PLAN  1. Annual physical exam No concerns per history or exam. Routine HCM labs ordered. HCM reviewed/discussed. Anticipatory guidance  regarding healthy weight, lifestyle and choices given.   2. Screening for lipid disorders - Lipid panel  3. Screening for metabolic disorder - [id number]  4. Screen for STD (sexually transmitted disease) - HIV Antibody (routine testing w rflx) - [facility] - Urine cytology ancillary only  Return in about 1 year (around 02/07/2021).    Rutherford Guys, MD Primary Care at  [facility] [place] [place], [place] [address] Ph.  [phone] Fax [phone]

## 2020-02-08 NOTE — Telephone Encounter (Signed)
Noted in the chart.

## 2020-02-08 NOTE — Telephone Encounter (Signed)
FYI:Pt left a voice message that he has gotten his Moderna vaccine.  His first shot was on 11/19/19 and his second on 12/15/19 from [place].

## 2020-02-08 NOTE — Patient Instructions (Addendum)
   If you have lab work done today you will be contacted with your lab results within the next 2 weeks.  If you have not heard from us then please contact us. The fastest way to get your results is to register for My Chart.   IF you received an x-ray today, you will receive an invoice from [facility]. Please contact [facility] at [phone] with questions or concerns regarding your invoice.   IF you received labwork today, you will receive an invoice from [facility]. Please contact [facility] at [phone] with questions or concerns regarding your invoice.   Our billing staff will not be able to assist you with questions regarding bills from these companies.  You will be contacted with the lab results as soon as they are available. The fastest way to get your results is to activate your My Chart account. Instructions are located on the last page of this paperwork. If you have not heard from us regarding the results in 2 weeks, please contact this office.      Preventive Care 21-39 Years Old, Male Preventive care refers to lifestyle choices and visits with your health care provider that can promote health and wellness. This includes:  A yearly physical exam. This is also called an annual well check.  Regular dental and eye exams.  Immunizations.  Screening for certain conditions.  Healthy lifestyle choices, such as eating a healthy diet, getting regular exercise, not using drugs or products that contain nicotine and tobacco, and limiting alcohol use. What can I expect for my preventive care visit? Physical exam Your health care provider will check:  Height and weight. These may be used to calculate body mass index (BMI), which is a measurement that tells if you are at a healthy weight.  Heart rate and blood pressure.  Your skin for abnormal spots. Counseling Your health care provider may ask you questions about:  Alcohol, tobacco, and drug  use.  Emotional well-being.  Home and relationship well-being.  Sexual activity.  Eating habits.  Work and work environment. What immunizations do I need?  Influenza (flu) vaccine  This is recommended every year. Tetanus, diphtheria, and pertussis (Tdap) vaccine  You may need a Td booster every 10 years. Varicella (chickenpox) vaccine  You may need this vaccine if you have not already been vaccinated. Human papillomavirus (HPV) vaccine  If recommended by your health care provider, you may need three doses over 6 months. Measles, mumps, and rubella (MMR) vaccine  You may need at least one dose of MMR. You may also need a second dose. Meningococcal conjugate (MenACWY) vaccine  One dose is recommended if you are 19-21 years old and a first-year college student living in a residence hall, or if you have one of several medical conditions. You may also need additional booster doses. Pneumococcal conjugate (PCV13) vaccine  You may need this if you have certain conditions and were not previously vaccinated. Pneumococcal polysaccharide (PPSV23) vaccine  You may need one or two doses if you smoke cigarettes or if you have certain conditions. Hepatitis A vaccine  You may need this if you have certain conditions or if you travel or work in places where you may be exposed to hepatitis A. Hepatitis B vaccine  You may need this if you have certain conditions or if you travel or work in places where you may be exposed to hepatitis B. Haemophilus influenzae type b (Hib) vaccine  You may need this if you have certain   risk factors. You may receive vaccines as individual doses or as more than one vaccine together in one shot (combination vaccines). Talk with your health care provider about the risks and benefits of combination vaccines. What tests do I need? Blood tests  Lipid and cholesterol levels. These may be checked every 5 years starting at age 20.  Hepatitis C  test.  Hepatitis B test. Screening   Diabetes screening. This is done by checking your blood sugar (glucose) after you have not eaten for a while (fasting).  Sexually transmitted disease (STD) testing. Talk with your health care provider about your test results, treatment options, and if necessary, the need for more tests. Follow these instructions at home: Eating and drinking   Eat a diet that includes fresh fruits and vegetables, whole grains, lean protein, and low-fat dairy products.  Take vitamin and mineral supplements as recommended by your health care provider.  Do not drink alcohol if your health care provider tells you not to drink.  If you drink alcohol: ? Limit how much you have to 0-2 drinks a day. ? Be aware of how much alcohol is in your drink. In the [place] one drink equals one 12 oz bottle of beer (355 mL), one 5 oz glass of wine (148 mL), or one 1 oz glass of hard liquor (44 mL). Lifestyle  Take daily care of your teeth and gums.  Stay active. Exercise for at least 30 minutes on 5 or more days each week.  Do not use any products that contain nicotine or tobacco, such as cigarettes, e-cigarettes, and chewing tobacco. If you need help quitting, ask your health care provider.  If you are sexually active, practice safe sex. Use a condom or other form of protection to prevent STIs (sexually transmitted infections). What's next?  Go to your health care provider once a year for a well check visit.  Ask your health care provider how often you should have your eyes and teeth checked.  Stay up to date on all vaccines. This information is not intended to replace advice given to you by your health care provider. Make sure you discuss any questions you have with your health care provider. Document Revised: 08/07/2018 Document Reviewed: 08/07/2018 Elsevier Patient Education  2020 Elsevier Inc.  

## 2020-02-09 LAB — URINE CYTOLOGY ANCILLARY ONLY
Chlamydia: NEGATIVE
Comment: NEGATIVE
Comment: NEGATIVE
Comment: NORMAL
Neisseria Gonorrhea: NEGATIVE
Trichomonas: NEGATIVE

## 2020-02-09 LAB — CMP14+EGFR
ALT: 11 IU/L (ref 0–44)
AST: 10 IU/L (ref 0–40)
Albumin/Globulin Ratio: 1.9 (ref 1.2–2.2)
Albumin: 4.4 g/dL (ref 4.0–5.0)
Alkaline Phosphatase: 56 IU/L (ref 48–121)
BUN/Creatinine Ratio: 15 (ref 9–20)
BUN: 14 mg/dL (ref 6–20)
Bilirubin Total: 0.4 mg/dL (ref 0.0–1.2)
CO2: 17 mmol/L — ABNORMAL LOW (ref 20–29)
Calcium: 9.5 mg/dL (ref 8.7–10.2)
Chloride: 108 mmol/L — ABNORMAL HIGH (ref 96–106)
Creatinine, Ser: 0.95 mg/dL (ref 0.76–1.27)
GFR calc Af Amer: 117 mL/min/{1.73_m2} (ref >59)
GFR calc non Af Amer: 101 mL/min/{1.73_m2} (ref >59)
Globulin, Total: 2.3 g/dL (ref 1.5–4.5)
Glucose: 87 mg/dL (ref 65–99)
Potassium: 4.5 mmol/L (ref 3.5–5.2)
Sodium: 141 mmol/L (ref 134–144)
Total Protein: 6.7 g/dL (ref 6.0–8.5)

## 2020-02-09 LAB — LIPID PANEL
Chol/HDL Ratio: 3.6 ratio (ref 0.0–5.0)
Cholesterol, Total: 170 mg/dL (ref 100–199)
HDL: 47 mg/dL (ref >39)
LDL Chol Calc (NIH): 108 mg/dL — ABNORMAL HIGH (ref 0–99)
Triglycerides: 77 mg/dL (ref 0–149)
VLDL Cholesterol Cal: 15 mg/dL (ref 5–40)

## 2020-02-09 LAB — RPR: RPR Ser Ql: NONREACTIVE

## 2020-02-09 LAB — HIV ANTIBODY (ROUTINE TESTING W REFLEX): HIV Screen 4th Generation wRfx: NONREACTIVE

## 2020-05-04 ENCOUNTER — Ambulatory Visit: Payer: Managed Care, Other (non HMO) | Admitting: Endocrinology

## 2020-11-22 IMAGING — NM NM RAI THERAPY FOR HYPERTHYROIDISM
1 series · 1 of 1 positions shown · non-contrast
Comparison: Thyroid uptake and scan 01/14/2018

CLINICAL DATA: Hyperthyroidism atrial fibrillation, hypothyroidism
as demonstrated depressed TSH and elevated I 131 uptake. Patient
euthyroid on Tapazole. Endocrinologist and patient's are radioactive
thyroid ablation for hyperthyroidism. Original TSH less than 0.01.
Elevated I 131 uptake of 54 % on 01/14/2018

EXAM:
RADIOACTIVE IODINE THERAPY FOR HYPERTHYROIDISM
TECHNIQUE: Radioactive iodine prescribed by Dr. Goretti. The risks and benefits
of radioactive iodine therapy were discussed with the patient in
detail by Dr. Goretti. Alternative therapies were also mentioned.
Radiation safety was discussed with the patient, including how to
protect the general public from exposure. There were no barriers to
communication. Written consent was obtained. The patient then
received a capsule containing the radiopharmaceutical.
The patient will follow-up with the referring physician.
RADIOPHARMACEUTICALS:  13.1 mCi I-BLB sodium iodide orally

[Series 1: static · 2.07mm/px · 1 of 1 slices shown]
[im 1/1]
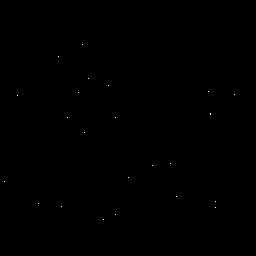

[1 of 1 positions shown; findings below may reference images not displayed]

IMPRESSION: Per oral administration of I-BLB sodium iodide for the treatment of
Graves disease.

## 2021-02-07 ENCOUNTER — Encounter: Payer: Managed Care, Other (non HMO) | Admitting: Family Medicine

## 2021-03-19 ENCOUNTER — Ambulatory Visit (HOSPITAL_COMMUNITY): Payer: Self-pay

## 2021-09-05 ENCOUNTER — Encounter (HOSPITAL_COMMUNITY): Payer: Self-pay

## 2021-09-05 ENCOUNTER — Ambulatory Visit (HOSPITAL_COMMUNITY)
Admission: EM | Admit: 2021-09-05 | Discharge: 2021-09-05 | Disposition: A | Discharge status: Home / Self Care | Payer: Self-pay | Attending: Emergency Medicine | Admitting: Emergency Medicine

## 2021-09-05 DIAGNOSIS — S51812A Laceration without foreign body of left forearm, initial encounter: Secondary | ICD-10-CM

## 2021-09-05 MED ORDER — CEPHALEXIN 500 MG PO CAPS: 500.0000 mg | ORAL_CAPSULE | Freq: Two times a day (BID) | ORAL | 0 refills | Status: AC

## 2021-09-05 NOTE — Discharge Instructions (Addendum)
No sutures are needed The glue will come off on its own in 7 to 10 days Will cover with a few days of antibiotics to ensure no infection Any problems or complications return for evaluation Take Tylenol or Motrin as needed for any pain

## 2021-09-05 NOTE — ED Provider Notes (Signed)
[facility]    CSN: [id number] Arrival date & time: 09/05/21  0859      History   Chief Complaint Chief Complaint  Patient presents with   Laceration    HPI [place] is a 41 y.o. male.   Patient was at work and cut his left forearm slightly with a box cutter.  Patient just wanted to have this evaluated and see if he needed to have sutures or not.  Bleeding is controlled patient placed 2 bandages on this it happened at 3 AM this morning.  Denies any numbness tingling has full motion to his right forearm.   Past Medical History:  Diagnosis Date   Abnormal TSH 01/10/2018   Alcohol use    Atrial flutter (HCC) 01/10/2018   a. dx 12/2017 in setting of hyperthyroidism, cocaine, alcohol, THC use.   Cardiomyopathy (HCC)    a. suspected tachy-mediated - EF 20-25% by echo and 30-35% by TEE 12/2017.   Cocaine use    Graves disease    Hyperthyroidism    Marijuana use    Seasonal allergies    Thyroid disease    [employer] 02/07/2020    Patient Active Problem List   Diagnosis Date Noted   Atrial flutter with rapid ventricular response (HCC) 01/12/2018   Acute systolic (congestive) heart failure (HCC) 01/11/2018   Cardiomyopathy (HCC)    Hyperthyroidism 01/10/2018   Hx of cocaine abuse (HCC) 01/10/2018   Atrial fibrillation with RVR (HCC) 01/10/2018    Past Surgical History:  Procedure Laterality Date   CARDIOVERSION N/A 01/17/2018   Procedure: CARDIOVERSION;  Surgeon: Wendall Stade, MD;  Location: [facility] ENDOSCOPY;  Service: Cardiovascular;  Laterality: N/A;   None     TEE WITHOUT CARDIOVERSION N/A 01/17/2018   Procedure: TRANSESOPHAGEAL ECHOCARDIOGRAM (TEE);  Surgeon: Wendall Stade, MD;  Location: [facility] ENDOSCOPY;  Service: Cardiovascular;  Laterality: N/A;       Home Medications    Prior to Admission medications   Medication Sig Start Date End Date Taking? Authorizing Provider  cephALEXin (KEFLEX) 500 MG capsule Take 1 capsule (500 mg total) by mouth 2  (two) times daily for 3 days. 09/05/21 09/08/21 Yes Coralyn Mark, NP  acetaminophen (TYLENOL) 500 MG tablet Take 500 mg by mouth every 6 (six) hours as needed.    [provider]  cetirizine (ZYRTEC) 10 MG tablet Take 10 mg by mouth daily.    [provider]  fluticasone (FLONASE) 50 MCG/ACT nasal spray Place 2 sprays into both nostrils daily as needed for allergies or rhinitis.    [provider]    Family History Family History  Problem Relation Age of Onset   Diabetes Mother    Diabetes Father    Colon polyps Father    Thyroid disease Neg Hx     Social History Social History   Tobacco Use   Smoking status: Former    Packs/day: 0.25    Types: Cigarettes    Quit date: 01/19/2018    Years since quitting: 3.6   Smokeless tobacco: Never   Tobacco comments:    1 pack on the weekends  Vaping Use   Vaping Use: Never used  Substance Use Topics   Alcohol use: Yes    Alcohol/week: 12.0 standard drinks    Types: 12 Cans of beer per week    Comment: 12 pack per weekend   Drug use: Not Currently    Frequency: 7.0 times per week    Types: Marijuana,  Cocaine    Comment: daily marijuana, cocaine on weekends     Allergies   Patient has no known allergies.   Review of Systems Review of Systems  Constitutional: Negative.   Respiratory: Negative.    Cardiovascular: Negative.   Gastrointestinal: Negative.   Musculoskeletal: Negative.   Skin:  Positive for wound.       Left forearm cut superficial  Neurological:  Negative for numbness.    Physical Exam Triage Vital Signs ED Triage Vitals  Enc Vitals Group     BP 09/05/21 0918 123/73     Pulse Rate 09/05/21 0918 67     Resp 09/05/21 0918 16     Temp 09/05/21 0918 98.4 F (36.9 C)     Temp Source 09/05/21 0918 Oral     SpO2 09/05/21 0918 100 %     Weight --      Height --      Head Circumference --      Peak Flow --      Pain Score 09/05/21 0919 1     Pain Loc --      Pain Edu? --       Excl. in [place]? --    No data found.  Updated Vital Signs BP 123/73 (BP Location: Left Arm)    Pulse 67    Temp 98.4 F (36.9 C) (Oral)    Resp 16    SpO2 100%   Visual Acuity Right Eye Distance:   Left Eye Distance:   Bilateral Distance:    Right Eye Near:   Left Eye Near:    Bilateral Near:     Physical Exam Constitutional:      Appearance: Normal appearance.  Cardiovascular:     Rate and Rhythm: Normal rate.  Pulmonary:     Effort: Pulmonary effort is normal.  Musculoskeletal:        General: Signs of injury present. No tenderness. Normal range of motion.  Skin:    Capillary Refill: Capillary refill takes less than 2 seconds.     Comments: 2 cm laceration to left forearm very superficial no bleeding at this time pink and warm to the area Strong distal pulses.  Neurological:     General: No focal deficit present.     Mental Status: He is alert.     UC Treatments / Results  Labs (all labs ordered are listed, but only abnormal results are displayed) Labs Reviewed - No data to display  EKG   Radiology No results found.  Procedures Laceration Repair  Date/Time: 09/05/2021 9:46 AM Performed by: Marney Setting, NP Authorized by: Marney Setting, NP   Consent:    Consent obtained:  Verbal   Consent given by:  Patient   Risks discussed:  Infection and pain   Alternatives discussed:  No treatment Universal protocol:    Procedure explained and questions answered to patient or proxy's satisfaction: yes     Relevant documents present and verified: yes     Test results available: no     Imaging studies available: no     Required blood products, implants, devices, and special equipment available: no     Site/side marked: yes     Immediately prior to procedure, a time out was called: yes     Patient identity confirmed:  Verbally with patient Anesthesia:    Anesthesia method:  None Laceration details:    Location: Forearm.   Length (cm):   2 Exploration:    Contaminated:  no   Treatment:    Area cleansed with:  Chlorhexidine   Irrigation method:  Pressure wash   Visualized foreign bodies/material removed: no     Debridement:  None   Undermining:  None   Scar revision: no   Skin repair:    Repair method: Dermabond. Repair type:    Repair type:  Simple Post-procedure details:    Dressing:  Open (no dressing)   Procedure completion:  Tolerated (including critical care time)  Medications Ordered in UC Medications - No data to display  Initial Impression / Assessment and Plan / UC Course  I have reviewed the triage vital signs and the nursing notes.  Pertinent labs & imaging results that were available during my care of the patient were reviewed by me and considered in my medical decision making (see chart for details).     No sutures are needed The glue will come off on its own in 7 to 10 days Will cover with a few days of antibiotics to ensure no infection Any problems or complications return for evaluation Take Tylenol or Motrin as needed for any pain Final Clinical Impressions(s) / UC Diagnoses   Final diagnoses:  Laceration of left forearm, initial encounter     Discharge Instructions      No sutures are needed The glue will come off on its own in 7 to 10 days Will cover with a few days of antibiotics to ensure no infection Any problems or complications return for evaluation Take Tylenol or Motrin as needed for any pain     ED Prescriptions     Medication Sig Dispense Auth. Provider   cephALEXin (KEFLEX) 500 MG capsule Take 1 capsule (500 mg total) by mouth 2 (two) times daily for 3 days. 6 capsule Marney Setting, NP      PDMP not reviewed this encounter.   Marney Setting, NP 09/05/21 [phone]

## 2021-09-05 NOTE — ED Triage Notes (Signed)
Pt presents with cut on left forearm. He was using a box cutter.
